# Patient Record
Sex: Male | Born: 1980 | Race: White | Hispanic: No | Marital: Married
Health system: Midwestern US, Academic
[De-identification: ages and names within clinical notes are randomized; demographics above are authoritative.]

## PROBLEM LIST (undated history)

## (undated) DIAGNOSIS — I639 Cerebral infarction, unspecified: Principal | ICD-10-CM

## (undated) DIAGNOSIS — I509 Heart failure, unspecified: Secondary | ICD-10-CM

## (undated) DIAGNOSIS — M359 Systemic involvement of connective tissue, unspecified: Secondary | ICD-10-CM

## (undated) DIAGNOSIS — E119 Type 2 diabetes mellitus without complications: Secondary | ICD-10-CM

## (undated) DIAGNOSIS — D860 Sarcoidosis of lung: Secondary | ICD-10-CM

## (undated) DIAGNOSIS — I10 Essential (primary) hypertension: Secondary | ICD-10-CM

## (undated) HISTORY — PX: TONSILLECTOMY: SUR1361

---
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
## 2012-06-19 ENCOUNTER — Encounter: Payer: Self-pay | Admitting: Internal Medicine

## 2012-06-19 ENCOUNTER — Other Ambulatory Visit (INDEPENDENT_AMBULATORY_CARE_PROVIDER_SITE_OTHER): Payer: Self-pay

## 2012-06-19 ENCOUNTER — Ambulatory Visit (INDEPENDENT_AMBULATORY_CARE_PROVIDER_SITE_OTHER)
Admission: RE | Admit: 2012-06-19 | Discharge: 2012-06-19 | Disposition: A | Discharge status: Home / Self Care | Payer: Self-pay | Source: Ambulatory Visit | Attending: Internal Medicine | Admitting: Internal Medicine

## 2012-06-19 ENCOUNTER — Ambulatory Visit (INDEPENDENT_AMBULATORY_CARE_PROVIDER_SITE_OTHER): Payer: Self-pay | Admitting: Internal Medicine

## 2012-06-19 VITALS — BP 130/92 | HR 85 | Temp 98.3°F | Ht 70.0 in | Wt >= 6400 oz

## 2012-06-19 DIAGNOSIS — D869 Sarcoidosis, unspecified: Secondary | ICD-10-CM

## 2012-06-19 DIAGNOSIS — R635 Abnormal weight gain: Secondary | ICD-10-CM | POA: Insufficient documentation

## 2012-06-19 LAB — HEPATIC FUNCTION PANEL
ALT: 19 U/L (ref 0–53)
AST: 19 U/L (ref 0–37)
Bilirubin, Direct: 0.1 mg/dL (ref 0.0–0.3)
Total Bilirubin: 0.4 mg/dL (ref 0.3–1.2)
Total Protein: 7.6 g/dL (ref 6.0–8.3)

## 2012-06-19 LAB — SEDIMENTATION RATE: Sed Rate: 29 mm/hr — ABNORMAL HIGH (ref 0–22)

## 2012-06-19 LAB — BASIC METABOLIC PANEL
BUN: 12 mg/dL (ref 6–23)
Calcium: 8.9 mg/dL (ref 8.4–10.5)
Creatinine, Ser: 1 mg/dL (ref 0.4–1.5)
GFR: 113.5 mL/min (ref >60.00)
Glucose, Bld: 151 mg/dL — ABNORMAL HIGH (ref 70–99)
Potassium: 3.6 mEq/L (ref 3.5–5.1)

## 2012-06-19 MED ORDER — HYDROXYCHLOROQUINE SULFATE 200 MG PO TABS: 200.0000 mg | ORAL_TABLET | Freq: Every day | ORAL | Status: DC

## 2012-06-19 NOTE — Patient Instructions (Addendum)
Sarcoidosis is a benign inflammatory condition caused by  The  immune system being too revved up like a thermostat on your furnace that's partially  stuck causing arthitis, rash, short of breath and cough and vision issues.  It typically burns itself out in 75% of patients by the end of 3 years with little to indicate that we really change the natural course of the disease by aggressive treatments intended to alter it.  Treatment is generally reserved for patients with major symptoms we can attribute to sarcoid or if a vital organ (like the eye or kidney or nervous system) becomes affected.    Please remember to go to the lab and x-ray department downstairs for your tests - we will call you with the results when they are available.    Plaquenil 200 mg one daily   Stop smoking before smoking stops you  Please schedule a follow up office visit in 4 weeks, sooner if needed with pfts

## 2012-06-19 NOTE — Progress Notes (Signed)
  Subjective:    Patient ID: Stephen Conley, male    DOB: [birth year]  MRN: [id number]  HPI  40 yobm smoker dx with sarcoidosis around 2010 [place] self referred to pulmonary clinic 06/19/2012 for sob   06/19/2012  1st pulmonary eval cc dx 4 years prior to OV   sarcoid for rash and sob dx by skin  bx rx steroids x around 2 years and since quit breathing is back to worse and rash back.  Gained 70 lb on prednsione and no better since stopped.  Doe x 50 ft..  No obvious daytime variabilty or assoc excess mucus production or cp or chest tightness, subjective wheeze overt sinus or hb symptoms. No unusual exp hx or h/o childhood pna/ asthma or premature birth to his knowledge.   Sleeping ok propped up without nocturnal  or early am exacerbation  of respiratory  c/o's or need for noct saba. Also denies any obvious fluctuation of symptoms with weather or environmental changes or other aggravating or alleviating factors except as outlined above    Review of Systems  Constitutional: Negative for fever and unexpected weight change.  HENT: Positive for sneezing and dental problem. Negative for ear pain, nosebleeds, congestion, sore throat, rhinorrhea, trouble swallowing, postnasal drip and sinus pressure.   Eyes: Negative for redness and itching.  Respiratory: Positive for cough and shortness of breath. Negative for chest tightness and wheezing.   Cardiovascular: Negative for palpitations and leg swelling.  Gastrointestinal: Negative for nausea and vomiting.  Genitourinary: Negative for dysuria.  Musculoskeletal: Positive for joint swelling.  Skin: Negative for rash.  Neurological: Negative for headaches.  Hematological: Does not bruise/bleed easily.  Psychiatric/Behavioral: Negative for dysphoric mood. The patient is not nervous/anxious.        Objective:   Physical Exam Obese bm nad  Wt Readings from Last 3 Encounters:  06/19/12 465 lb (210.923 kg)    Gen: No acute distress. Massively  obese but ambulatory  Neurological: Alert and oriented to person, place, and time. Coordination normal.  Head: Normocephalic and atraumatic.  Eyes: Conjunctivae are normal. Pupils are equal, round, and reactive to light. No scleral icterus.  Neck: Normal range of motion. Neck supple. No tracheal deviation or thyromegaly present. No cervical lymphadenopathy.  Cardiovascular: Normal rate, regular rhythm, normal heart sounds and intact distal pulses. Exam reveals no gallop and no friction rub. No murmur heard.  Respiratory: Effort normal. No respiratory distress. No chest wall tenderness. Breath sounds normal. No wheezes, rales or rhonchi.  GI: Soft. Bowel sounds are normal. The abdomen is soft and nontender. There is no rebound and no guarding.  Musculoskeletal: Normal range of motion. Extremities are nontender.  Skin: Skin is warm and dry.  Scattered hyperpigmented macular lesions all < nickle sized mostly over trunk and sparing face and ext. Psychiatric: Normal mood and affect. Behavior is unusual, ? A bit manic?   CXR  06/19/2012 :   Scarring in the bases. No appreciable adenopathy. No consolidation. The classic upper lobe interstitial disease seen with later phases of sarcoidosis is not appreciable on this study.   Labs 1/14 with nl tsh, esr, lfts and calcium      Assessment & Plan:

## 2012-06-20 NOTE — Assessment & Plan Note (Signed)
-   TSH nl 06/19/12  Undoubtedly partly related to steroid exposure which needs to be avoided in absence of solid evidence for significant active sarcoid which is lacking at this point  reviewed with pt: Weight control is simply a matter of calorie balance which needs to be tilted in your favor by eating less and exercising more.  To get the most out of exercise, you need to be continuously aware that you are short of breath, but never out of breath, for 30 minutes daily. As you improve, it will actually be easier for you to do the same amount of exercise  in  30 minutes so always push to the level where you are short of breath.  If this does not result in gradual weight reduction then I strongly recommend you see a nutritionist with a food diary x 2 weeks so that we can work out a negative calorie balance which is universally effective in steady weight loss programs.  Think of your calorie balance like you do your bank account where in this case you want the balance to go down so you must take in less calories than you burn up.  It's just that simple:  Hard to do, but easy to understand.  Good luck!

## 2012-06-20 NOTE — Assessment & Plan Note (Signed)
Only evidence of active sarcoid is rash  Discussed in detail all the  indications, usual  risks and alternatives  relative to the benefits with patient who agrees to proceed with trial of plaquenil and if not effective > derm eval next

## 2012-06-20 NOTE — Progress Notes (Signed)
Quick Note:  Spoke with pt and notified of results per Dr. Wert. Pt verbalized understanding and denied any questions.  ______ 

## 2012-07-24 ENCOUNTER — Ambulatory Visit: Payer: Self-pay | Admitting: Internal Medicine

## 2012-08-21 ENCOUNTER — Ambulatory Visit: Payer: Self-pay | Admitting: Internal Medicine

## 2012-09-14 ENCOUNTER — Telehealth: Payer: Self-pay | Admitting: Internal Medicine

## 2012-09-14 NOTE — Telephone Encounter (Signed)
ATC Myrene Buddy, no answer. [id number]

## 2012-09-17 NOTE — Telephone Encounter (Signed)
Spoke with pt's mother and answered questions about sarcoid but instructed that i was unable to answer questions regarding pt's health unless pt feels out authorization for [place] to do so.  She verified understanding.

## 2012-09-20 ENCOUNTER — Ambulatory Visit: Payer: Self-pay | Admitting: Internal Medicine

## 2012-09-25 ENCOUNTER — Ambulatory Visit: Payer: Self-pay | Admitting: Internal Medicine

## 2012-11-09 ENCOUNTER — Encounter: Payer: Self-pay | Admitting: Internal Medicine

## 2012-11-09 ENCOUNTER — Ambulatory Visit (INDEPENDENT_AMBULATORY_CARE_PROVIDER_SITE_OTHER): Payer: Self-pay | Admitting: Internal Medicine

## 2012-11-09 VITALS — BP 134/74 | HR 68 | Temp 97.4°F | Ht 68.0 in | Wt >= 6400 oz

## 2012-11-09 DIAGNOSIS — D869 Sarcoidosis, unspecified: Secondary | ICD-10-CM

## 2012-11-09 LAB — PULMONARY FUNCTION TEST

## 2012-11-09 NOTE — Progress Notes (Signed)
PFT done today. 

## 2012-11-09 NOTE — Patient Instructions (Addendum)
No evidence of sarcoid anywhere except in your skin - call if you would like [place] to refer you dermatologist  Congratulations on stopping smoking - unless you resume smoking your lung function should be fine though breathing may be limited by your weight   Pulmonary follow up is as needed

## 2012-11-09 NOTE — Progress Notes (Signed)
Subjective:    Patient ID: Stephen Conley, male    DOB: [birth year]  MRN: [id number]  HPI  81 yobm quit smoking May 2104  dx with sarcoidosis around 2010 Stephen Conley self referred to pulmonary clinic 06/19/2012 for sob in [place] since 2014    06/19/2012  1st pulmonary eval cc dx 4 years prior to OV   sarcoid for rash and sob dx by skin  bx rx steroids x around 2 years and since quit breathing is back to worse and rash back.  Gained 70 lb on prednsione and no better since stopped.  Doe x 50 ft..  No obvious daytime variabilty or assoc excess mucus production or cp or chest tightness, subjective wheeze overt sinus or hb symptoms. No unusual exp hx or h/o childhood pna/ asthma or premature birth to his knowledge.  rec Sarcoidosis pathophysiology   Please remember to go to the lab and x-ray department downstairs for your tests - we will call you with the results when they are available. Plaquenil 200 mg one daily  Stop smoking before smoking stops you  11/09/2012 f/u ov/[name] could not tell any difference with plaquenil so stopped it, still smking  Chief Complaint  Patient presents with  . Follow-up    Cough has improved, his breathing is unchanged since the last visit.     No obvious daytime variabilty or assoc excess mucus production or cp or chest tightness, subjective wheeze overt sinus or hb symptoms. No unusual exp hx or h/o childhood pna/ asthma or premature birth to his knowledge.   Sleeping ok propped up without nocturnal  or early am exacerbation  of respiratory  c/o's or need for noct saba. Also denies any obvious fluctuation of symptoms with weather or environmental changes or other aggravating or alleviating factors except as outlined above   Current Medications, Allergies, Past Medical History, Past Surgical History, Family History, and Social History were reviewed in [employer] record.  ROS  The following are not active complaints unless bolded sore  throat, dysphagia, dental problems, itching, sneezing,  nasal congestion or excess/ purulent secretions, ear ache,   fever, chills, sweats, unintended wt loss, pleuritic or exertional cp, hemoptysis,  orthopnea pnd or leg swelling, presyncope, palpitations, heartburn, abdominal pain, anorexia, nausea, vomiting, diarrhea  or change in bowel or urinary habits, change in stools or urine, dysuria,hematuria,  rash, arthralgias, visual complaints, headache, numbness weakness or ataxia or problems with walking or coordination,  change in mood/affect or memory.               Objective:   Physical Exam Obese bm nad   11/09/2012      476  Wt Readings from Last 3 Encounters:  06/19/12 465 lb (210.923 kg)    Gen: No acute distress. Massively obese but ambulatory  Neurological: Alert and oriented to person, place, and time. Coordination normal.  Head: Normocephalic and atraumatic.  Eyes: Conjunctivae are normal. Pupils are equal, round, and reactive to light. No scleral icterus.  Neck: Normal range of motion. Neck supple. No tracheal deviation or thyromegaly present. No cervical lymphadenopathy.  Cardiovascular: Normal rate, regular rhythm, normal heart sounds and intact distal pulses. Exam reveals no gallop and no friction rub. No murmur heard.  Respiratory: Effort normal. No respiratory distress. No chest wall tenderness. Breath sounds normal. No wheezes, rales or rhonchi.  GI: Soft. Bowel sounds are normal. The abdomen is soft and nontender. There is no rebound and no guarding.  Musculoskeletal: Normal range of  motion. Extremities are nontender.  Skin: Skin is warm and dry.  Scattered hyperpigmented macular lesions all < nickle sized mostly over trunk and sparing face and ext. Psychiatric: Normal mood and affect. Behavior is unusual, ? A bit manic?    CXR  06/19/2012 :   Scarring in the bases. No appreciable adenopathy. No consolidation. The classic upper lobe interstitial disease seen with later  phases of sarcoidosis is not appreciable on this study.   Labs 1/14 with nl tsh, esr, lfts and calcium      Assessment & Plan:

## 2012-11-10 NOTE — Assessment & Plan Note (Signed)
The only evidence of systemic sarcoid is the rash which apparently did not respond to plaquenil > f/u derm prn but no need for pulmoary f/u at this point as no evidence of active pulmonary sarcoid

## 2012-11-12 ENCOUNTER — Encounter: Payer: Self-pay | Admitting: Internal Medicine

## 2012-11-15 ENCOUNTER — Encounter: Payer: Self-pay | Admitting: Internal Medicine

## 2014-09-12 ENCOUNTER — Ambulatory Visit: Payer: 59 | Admitting: Internal Medicine

## 2014-10-09 ENCOUNTER — Ambulatory Visit: Payer: 59 | Admitting: Internal Medicine

## 2014-10-21 ENCOUNTER — Ambulatory Visit: Payer: 59 | Admitting: Family

## 2014-10-21 DIAGNOSIS — Z0289 Encounter for other administrative examinations: Secondary | ICD-10-CM

## 2014-10-29 ENCOUNTER — Emergency Department (HOSPITAL_COMMUNITY): Payer: 59

## 2014-10-29 ENCOUNTER — Inpatient Hospital Stay (HOSPITAL_COMMUNITY): Payer: 59

## 2014-10-29 ENCOUNTER — Encounter (HOSPITAL_COMMUNITY): Payer: Self-pay

## 2014-10-29 ENCOUNTER — Inpatient Hospital Stay (HOSPITAL_COMMUNITY)
Admission: EM | Admit: 2014-10-29 | Discharge: 2014-10-31 | DRG: 291 | Disposition: A | Discharge status: Home / Self Care | Payer: 59 | Attending: Family Medicine | Admitting: Family Medicine

## 2014-10-29 DIAGNOSIS — IMO0002 Reserved for concepts with insufficient information to code with codable children: Secondary | ICD-10-CM | POA: Insufficient documentation

## 2014-10-29 DIAGNOSIS — Z9981 Dependence on supplemental oxygen: Secondary | ICD-10-CM

## 2014-10-29 DIAGNOSIS — R0902 Hypoxemia: Secondary | ICD-10-CM

## 2014-10-29 DIAGNOSIS — I509 Heart failure, unspecified: Secondary | ICD-10-CM

## 2014-10-29 DIAGNOSIS — Z6841 Body Mass Index (BMI) 40.0 and over, adult: Secondary | ICD-10-CM | POA: Diagnosis not present

## 2014-10-29 DIAGNOSIS — F1290 Cannabis use, unspecified, uncomplicated: Secondary | ICD-10-CM | POA: Diagnosis present

## 2014-10-29 DIAGNOSIS — Z7982 Long term (current) use of aspirin: Secondary | ICD-10-CM | POA: Diagnosis not present

## 2014-10-29 DIAGNOSIS — R0689 Other abnormalities of breathing: Secondary | ICD-10-CM

## 2014-10-29 DIAGNOSIS — J984 Other disorders of lung: Secondary | ICD-10-CM | POA: Diagnosis present

## 2014-10-29 DIAGNOSIS — J9602 Acute respiratory failure with hypercapnia: Secondary | ICD-10-CM | POA: Diagnosis present

## 2014-10-29 DIAGNOSIS — R0609 Other forms of dyspnea: Secondary | ICD-10-CM | POA: Diagnosis not present

## 2014-10-29 DIAGNOSIS — F17210 Nicotine dependence, cigarettes, uncomplicated: Secondary | ICD-10-CM | POA: Diagnosis present

## 2014-10-29 DIAGNOSIS — E662 Morbid (severe) obesity with alveolar hypoventilation: Secondary | ICD-10-CM | POA: Diagnosis present

## 2014-10-29 DIAGNOSIS — J9601 Acute respiratory failure with hypoxia: Secondary | ICD-10-CM | POA: Diagnosis present

## 2014-10-29 DIAGNOSIS — J9622 Acute and chronic respiratory failure with hypercapnia: Secondary | ICD-10-CM | POA: Diagnosis not present

## 2014-10-29 DIAGNOSIS — I5031 Acute diastolic (congestive) heart failure: Principal | ICD-10-CM | POA: Diagnosis present

## 2014-10-29 DIAGNOSIS — Z713 Dietary counseling and surveillance: Secondary | ICD-10-CM | POA: Diagnosis present

## 2014-10-29 DIAGNOSIS — D869 Sarcoidosis, unspecified: Secondary | ICD-10-CM | POA: Diagnosis present

## 2014-10-29 DIAGNOSIS — Z716 Tobacco abuse counseling: Secondary | ICD-10-CM | POA: Diagnosis present

## 2014-10-29 DIAGNOSIS — E1165 Type 2 diabetes mellitus with hyperglycemia: Secondary | ICD-10-CM | POA: Diagnosis present

## 2014-10-29 DIAGNOSIS — G4733 Obstructive sleep apnea (adult) (pediatric): Secondary | ICD-10-CM

## 2014-10-29 DIAGNOSIS — R0602 Shortness of breath: Secondary | ICD-10-CM | POA: Diagnosis present

## 2014-10-29 DIAGNOSIS — I10 Essential (primary) hypertension: Secondary | ICD-10-CM | POA: Diagnosis present

## 2014-10-29 DIAGNOSIS — E119 Type 2 diabetes mellitus without complications: Secondary | ICD-10-CM | POA: Diagnosis not present

## 2014-10-29 DIAGNOSIS — J810 Acute pulmonary edema: Secondary | ICD-10-CM | POA: Diagnosis not present

## 2014-10-29 DIAGNOSIS — I272 Other secondary pulmonary hypertension: Secondary | ICD-10-CM | POA: Diagnosis present

## 2014-10-29 HISTORY — DX: Essential (primary) hypertension: I10

## 2014-10-29 HISTORY — DX: Type 2 diabetes mellitus without complications: E11.9

## 2014-10-29 HISTORY — DX: Systemic involvement of connective tissue, unspecified: M35.9

## 2014-10-29 HISTORY — DX: Sarcoidosis of lung: D86.0

## 2014-10-29 HISTORY — DX: Heart failure, unspecified: I50.9

## 2014-10-29 LAB — I-STAT TROPONIN, ED: Troponin i, poc: 0 ng/mL (ref 0.00–0.08)

## 2014-10-29 LAB — CBC WITH DIFFERENTIAL/PLATELET
Basophils Absolute: 0 10*3/uL (ref 0.0–0.1)
Basophils Relative: 0 % (ref 0–1)
EOS ABS: 0.3 10*3/uL (ref 0.0–0.7)
EOS PCT: 4 % (ref 0–5)
HCT: 52 % (ref 39.0–52.0)
HEMOGLOBIN: 16.7 g/dL (ref 13.0–17.0)
LYMPHS ABS: 1.3 10*3/uL (ref 0.7–4.0)
Lymphocytes Relative: 18 % (ref 12–46)
MCH: 25.6 pg — AB (ref 26.0–34.0)
MCHC: 32.1 g/dL (ref 30.0–36.0)
MCV: 79.8 fL (ref 78.0–100.0)
MONO ABS: 0.4 10*3/uL (ref 0.1–1.0)
Monocytes Relative: 6 % (ref 3–12)
NEUTROS PCT: 72 % (ref 43–77)
Neutro Abs: 5.1 10*3/uL (ref 1.7–7.7)
Platelets: 247 10*3/uL (ref 150–400)
RBC: 6.52 MIL/uL — AB (ref 4.22–5.81)
RDW: 17 % — ABNORMAL HIGH (ref 11.5–15.5)
WBC: 7.1 10*3/uL (ref 4.0–10.5)

## 2014-10-29 LAB — TROPONIN I

## 2014-10-29 LAB — GLUCOSE, CAPILLARY
GLUCOSE-CAPILLARY: 123 mg/dL — AB (ref 65–99)
Glucose-Capillary: 148 mg/dL — ABNORMAL HIGH (ref 65–99)

## 2014-10-29 LAB — TSH: TSH: 1.993 u[IU]/mL (ref 0.350–4.500)

## 2014-10-29 LAB — I-STAT ARTERIAL BLOOD GAS, ED
ACID-BASE EXCESS: 3 mmol/L — AB (ref 0.0–2.0)
BICARBONATE: 32.9 meq/L — AB (ref 20.0–24.0)
O2 SAT: 92 %
PCO2 ART: 69.6 mmHg — AB (ref 35.0–45.0)
PH ART: 7.282 — AB (ref 7.350–7.450)
Patient temperature: 98.4
TCO2: 35 mmol/L (ref 0–100)
pO2, Arterial: 73 mmHg — ABNORMAL LOW (ref 80.0–100.0)

## 2014-10-29 LAB — BASIC METABOLIC PANEL
ANION GAP: 10 (ref 5–15)
BUN: 8 mg/dL (ref 6–20)
CHLORIDE: 98 mmol/L — AB (ref 101–111)
CO2: 32 mmol/L (ref 22–32)
Calcium: 9 mg/dL (ref 8.9–10.3)
Creatinine, Ser: 1.12 mg/dL (ref 0.61–1.24)
GFR calc Af Amer: >60 mL/min (ref >60)
GLUCOSE: 202 mg/dL — AB (ref 65–99)
Potassium: 4.6 mmol/L (ref 3.5–5.1)
Sodium: 140 mmol/L (ref 135–145)

## 2014-10-29 LAB — MRSA PCR SCREENING: MRSA by PCR: NEGATIVE

## 2014-10-29 LAB — I-STAT CG4 LACTIC ACID, ED
Lactic Acid, Venous: 2.17 mmol/L (ref 0.5–2.0)
Lactic Acid, Venous: 1.15 mmol/L (ref 0.5–2.0)

## 2014-10-29 LAB — BRAIN NATRIURETIC PEPTIDE: B Natriuretic Peptide: 88.6 pg/mL (ref 0.0–100.0)

## 2014-10-29 MED ORDER — HEPARIN SODIUM (PORCINE) 5000 UNIT/ML IJ SOLN
5000.0000 [IU] | Freq: Three times a day (TID) | INTRAMUSCULAR | Status: DC
  Administered 2014-10-29 – 2014-10-31 (×6): 5000 [IU] via SUBCUTANEOUS
  Filled 2014-10-29 (×6): qty 1

## 2014-10-29 MED ORDER — SODIUM CHLORIDE 0.9 % IJ SOLN
3.0000 mL | Freq: Two times a day (BID) | INTRAMUSCULAR | Status: DC
  Administered 2014-10-30 (×2): 3 mL via INTRAVENOUS

## 2014-10-29 MED ORDER — ACETAMINOPHEN 650 MG RE SUPP: 650.0000 mg | Freq: Four times a day (QID) | RECTAL | Status: DC | PRN

## 2014-10-29 MED ORDER — PERFLUTREN LIPID MICROSPHERE
1.0000 mL | INTRAVENOUS | Status: AC | PRN
  Administered 2014-10-29: 2 mL via INTRAVENOUS
  Filled 2014-10-29: qty 10

## 2014-10-29 MED ORDER — IOHEXOL 350 MG/ML SOLN
100.0000 mL | Freq: Once | INTRAVENOUS | Status: AC | PRN
  Administered 2014-10-29: 100 mL via INTRAVENOUS

## 2014-10-29 MED ORDER — SODIUM CHLORIDE 0.9 % IV SOLN: 250.0000 mL | INTRAVENOUS | Status: DC | PRN

## 2014-10-29 MED ORDER — SODIUM CHLORIDE 0.9 % IJ SOLN: 3.0000 mL | INTRAMUSCULAR | Status: DC | PRN

## 2014-10-29 MED ORDER — SODIUM CHLORIDE 0.9 % IJ SOLN
3.0000 mL | Freq: Two times a day (BID) | INTRAMUSCULAR | Status: DC
  Administered 2014-10-29 – 2014-10-31 (×5): 3 mL via INTRAVENOUS

## 2014-10-29 MED ORDER — POTASSIUM CHLORIDE CRYS ER 20 MEQ PO TBCR
20.0000 meq | EXTENDED_RELEASE_TABLET | Freq: Once | ORAL | Status: AC
  Administered 2014-10-29: 20 meq via ORAL
  Filled 2014-10-29: qty 1

## 2014-10-29 MED ORDER — FUROSEMIDE 10 MG/ML IJ SOLN
40.0000 mg | INTRAMUSCULAR | Status: AC
  Administered 2014-10-29: 40 mg via INTRAVENOUS
  Filled 2014-10-29: qty 4

## 2014-10-29 MED ORDER — OXYCODONE HCL 5 MG PO TABS: 5.0000 mg | ORAL_TABLET | Freq: Once | ORAL | Status: DC

## 2014-10-29 MED ORDER — INSULIN ASPART 100 UNIT/ML ~~LOC~~ SOLN
0.0000 [IU] | Freq: Three times a day (TID) | SUBCUTANEOUS | Status: DC
  Administered 2014-10-29 – 2014-10-30 (×2): 1 [IU] via SUBCUTANEOUS

## 2014-10-29 MED ORDER — ACETAMINOPHEN 325 MG PO TABS
650.0000 mg | ORAL_TABLET | Freq: Once | ORAL | Status: AC
  Administered 2014-10-29: 650 mg via ORAL
  Filled 2014-10-29: qty 2

## 2014-10-29 MED ORDER — HYDRALAZINE HCL 20 MG/ML IJ SOLN
5.0000 mg | INTRAMUSCULAR | Status: DC | PRN
  Administered 2014-10-29: 5 mg via INTRAVENOUS
  Filled 2014-10-29: qty 1

## 2014-10-29 MED ORDER — NITROGLYCERIN IN D5W 200-5 MCG/ML-% IV SOLN
0.0000 ug/min | Freq: Once | INTRAVENOUS | Status: AC
  Administered 2014-10-29: 5 ug/min via INTRAVENOUS
  Filled 2014-10-29: qty 250

## 2014-10-29 MED ORDER — ACETAMINOPHEN 325 MG PO TABS
650.0000 mg | ORAL_TABLET | Freq: Four times a day (QID) | ORAL | Status: DC | PRN
  Administered 2014-10-30: 650 mg via ORAL
  Filled 2014-10-29: qty 2

## 2014-10-29 MED ORDER — ASPIRIN EC 81 MG PO TBEC
81.0000 mg | DELAYED_RELEASE_TABLET | Freq: Every day | ORAL | Status: DC
  Administered 2014-10-30 – 2014-10-31 (×2): 81 mg via ORAL
  Filled 2014-10-29 (×2): qty 1

## 2014-10-29 MED ORDER — FUROSEMIDE 10 MG/ML IJ SOLN
40.0000 mg | Freq: Once | INTRAMUSCULAR | Status: AC
  Administered 2014-10-29: 40 mg via INTRAVENOUS
  Filled 2014-10-29: qty 4

## 2014-10-29 NOTE — H&P (Signed)
Family Medicine [facility] Admission History and Physical Service Pager: [phone]  Patient name: Stephen Conley Medical record number: [id number] Date of birth: 06/13/1980 Age: 34 y.o. Gender: male  Primary Care Provider: No primary care provider on file. Consultants: none Code Status: full code (discussed with pt upon admission)  Chief Complaint: dyspnea, chest pain  Assessment and Plan: Stephen Conley is a 34 y.o. male presenting with chest pain and dypsnea. PMH is significant for sarcoidosis and morbid obesity.  # Dyspnea & chest pain: initial O2 sats in 70's, requiring supplemental O2 with 15 L via nonrebreather. Initial ABG with evidence of respiratory acidosis, pH 7.28. Differential diagnosis includes ACS, PE, flare of sarcoidosis, and CHF. Chest x-ray with evidence of CHF. Given acute onset, initially had high concern for pulmonary embolus but fortunately CTA was negative for PE. Blood pressures have been relatively markedly elevated as high as 200s/120s, raising concern for hypertensive emergency. Has been placed on nitroglycerin drip for blood pressure control in setting of acute CHF exacerbation. -Admit to stepdown unit -Continue nitroglycerin drip -Monitor on telemetry -Cycle troponins 3 -EKG in a.m. -Risk stratification: A1c, TSH, lipids -Echocardiogram -Status post 1 dose of 40 mg IV Lasix, monitor diuresis and repeat as needed -Strict intake and output -Aspirin 81 mg daily -Hydralazine IV as needed for elevated pressures -May benefit from cardiology evaluation during this hospitalization, but we'll await above studies prior to consultation  # Hyperglycemia: glucose noted to be 200. Unsure if acute adrenal response to volume overload versus chronic undiagnosed diabetes. - check A1c - sensitive SSI while inpatient  # Sarcoidosis: needs outpatient pulm follow up, as well as to establish with PCP. Has appointment scheduled to establish care with PCP on  June 3 at [facility].  FEN/GI: SLIV, HH diet Prophylaxis: SQ heparin  Disposition: admit to stepdown, dispo pending further eval  History of Present Illness: Stephen Conley is a 34 y.o. male presenting with chest pain and dyspnea.  Patient reports that around 5 this morning he had sudden onset of chest pain and shortness of breath. He thus came to the hospital. The pain is located in the middle of his chest. He has noticed increased swelling in his legs over the last several months. He has a history of sarcoidosis but has not followed up with pulmonology in several years. Does not have a PCP currently. He does not take any medications chronically. He does smoke daily, 2-3 cigarettes per day for the last 10 years. He endorses 1 episode of exertional chest pain recently. Denies any history of cardiac problems or blood clots.  He states he is eating and drinking well, normal bowel and bladder function.  Review Of Systems: Per HPI. Otherwise 12 point review of systems was performed and was unremarkable.  Patient Active Problem List   Diagnosis Date Noted  . 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 Hypoxia 10/29/2014  . Sarcoid 06/19/2012  . Weight gain 06/19/2012   Past Medical History: Past Medical History  Diagnosis Date  . Sarcoidosis, lung   . Collagen vascular disease    Past Surgical History: Past Surgical History  Procedure Laterality Date  . Tonsillectomy     Social History: History  Substance Use Topics  . Smoking status: Former Smoker -- 0.80 packs/day for 10 years    Types: Cigarettes    Quit date: 10/09/2012  . Smokeless tobacco: Never Used  . Alcohol Use: Not on file   Additional social history: active smoker (2-3 cigs/day for 10  years). Occasional MJ use, denies other drug use. Occasional alcohol (nothing daily)  Please also refer to relevant sections of EMR.  Family History: No family history on file. Allergies and Medications: No Known  Allergies No current facility-administered medications on file prior to encounter.   Current Outpatient Prescriptions on File Prior to Encounter  Medication Sig Dispense Refill  . hydroxychloroquine (PLAQUENIL) 200 MG tablet Take 1 tablet (200 mg total) by mouth daily. (Patient not taking: Reported on 10/29/2014) 30 tablet 2    Objective: BP 184/120 mmHg  Pulse 81  Temp(Src) 98.4 F (36.9 C) (Oral)  Resp 26  Ht  (1.727 m)  Wt 470 lb 3.2 oz (213.281 kg)  BMI 71.51 kg/m2  SpO2 95% Exam: General: No acute distress, pleasant, cooperative Eyes: Extraocular movements intact ENTM: Normocephalic, atraumatic, oropharynx clear and moist. Nasal cannula in place Neck: No meningeal signs Cardiovascular: Heart sounds distant. Regular rate and rhythm, no murmur Respiratory: Diminished breath sounds throughout likely secondary to habitus, some crackles present in bilateral lower lobes Abdomen: Normoactive bowel sounds, soft, nontender to palpation, morbidly obese MSK: 2+ lower extremity edema bilaterally, legs markedly obese, 2+ DP pulses bilaterally Skin: No rashes noted, some dry skin on feet Neuro: Grossly nonfocal, speech normal Psych: Normal affect, makes jokes during history taking, pleasant  Labs and Imaging: CBC BMET   Recent Labs Lab 10/29/14 0630  WBC 7.1  HGB 16.7  HCT 52.0  PLT 247    Recent Labs Lab 10/29/14 0630  NA 140  K 4.6  CL 98*  CO2 32  BUN 8  CREATININE 1.12  GLUCOSE 202*  CALCIUM 9.0     EKG sinus rhythm, no ST or T wave changes  CXR: Congestive heart failure. Likely underlying changes of sarcoid in this patient with a known history.  CTA Chest: -No evidence of pulmonary emboli. -Cardiomegaly with bilateral hazy perihilar opacification likely mild interstitial edema. -Minimal mediastinal adenopathy likely related to patient's known Sarcoid.  BNP 89 Lactic acid 2.17>1.15   Latrelle Dodrill, MD 10/29/2014, 10:55 AM PGY-3, [facility]  Health Family Medicine FPTS Intern pager: [phone], text pages welcome

## 2014-10-29 NOTE — ED Notes (Signed)
Pt on 4 L [place] oxygen saturation at 99%

## 2014-10-29 NOTE — Consult Note (Signed)
Name: Stephen Conley MRN: [id number] DOB: 1980-09-08    ADMISSION DATE:  10/29/2014 CONSULTATION DATE:  10/29/2014  REFERRING MD :  Gwendolyn Grant  CHIEF COMPLAINT:  SOB, chest pain  BRIEF PATIENT DESCRIPTION: 34 y.o. morbidly obese M with PMH of sarcoidosis and CHF brought to [facility] ED 5/25 with SOB and chest pain.  Found to have pulmonary edema and probable CHF exacerbation.  PCCM consulted for dyspnea.  SIGNIFICANT EVENTS  5/25 - admitted with SOB, chest pain  STUDIES:  CTA Chest 5/25 >>> neg for PE.  Cardiomegaly with b/l perihilar opacities and interstitial edema.  Mediastinal adenopathy most likely related to pt's known sarcoid.   HISTORY OF PRESENT ILLNESS:  Stephen Conley is a 69 y.o. Morbidly obese male with PMH of sarcoidosis (followed by Dr. Sherene Sires) and CHF who presented to [facility] ED 5/25 with chest pain and SOB.  Pain was mid sternum and non - radiating.  He has seen a pulmonologist for sarcoid before but it was several years ago in [place].  He is a current smoker, 2 - 3 cigarettes per day for the past 10 years.  No hx of clots of malignancy, no recent travel.    In ED, he was initially hypoxic with sats in 70 on room air.  He was placed on NRB with improvement to 100%.  He was later able to be switched to [facility] and maintained sats of 96% on 3L O2.  CTA in ED was negative for PE but showed bilateral opacities and interstitial edema.  Mediastinal adenopathy was also noted, most likely reflective of his known sarcoid.  Per Dr. Thurston Hole notes, pt was previously on steroids for his sarcoid but stopped sometime in 2012.  PCCM was consulted for recs regarding his dyspnea.  He denies fevers/chills/sweats, headache, cough, N/V/D, abd pain, myalgias. He endorses LE edema, exertional dyspnea, and orthopnea (sleeps with 3 pillows).  He does not work and is sedentary at baseline with minimal movement around the house.   PAST MEDICAL HISTORY :   has a past medical history of Sarcoidosis, lung; Collagen  vascular disease; and Congestive heart disease.  has past surgical history that includes Tonsillectomy. Prior to Admission medications   Medication Sig Start Date End Date Taking? Authorizing Provider  hydroxychloroquine (PLAQUENIL) 200 MG tablet Take 1 tablet (200 mg total) by mouth daily. Patient not taking: Reported on 10/29/2014 06/19/12   Nyoka Cowden, MD   No Known Allergies  FAMILY HISTORY:  family history is not on file. SOCIAL HISTORY:  reports that he quit smoking about 12 months ago. His smoking use included Cigarettes. He has a 8 pack-year smoking history. He has never used smokeless tobacco. He reports that he uses illicit drugs (Marijuana). He reports that he does not drink alcohol.  REVIEW OF SYSTEMS:   All negative; except for those that are bolded, which indicate positives.  Constitutional: weight loss, weight gain, night sweats, fevers, chills, fatigue, weakness.  HEENT: headaches, sore throat, sneezing, nasal congestion, post nasal drip, difficulty swallowing, tooth/dental problems, visual complaints, visual changes, ear aches. Neuro: difficulty with speech, weakness, numbness, ataxia. CV:  chest pain, orthopnea, PND, swelling in lower extremities, dizziness, palpitations, syncope.  Resp: cough, hemoptysis, dyspnea, wheezing. GI  heartburn, indigestion, abdominal pain, nausea, vomiting, diarrhea, constipation, change in bowel habits, loss of appetite, hematemesis, melena, hematochezia.  GU: dysuria, change in color of urine, urgency or frequency, flank pain, hematuria. MSK: joint pain or swelling, decreased range of motion. Psych: change in mood or affect,  depression, anxiety, suicidal ideations, homicidal ideations. Skin: rash, itching, bruising.   SUBJECTIVE:  Reports breathing much better.  He states he is chronically dyspneic, even at rest.  Chest pain improved.  VITAL SIGNS: Temp:  [98.4 F (36.9 C)] 98.4 F (36.9 C) (05/25 0617) Pulse Rate:  [81-95] 95  (05/25 1536) Resp:  [21-36] 26 (05/25 1536) BP: (145-207)/(95-138) 145/96 mmHg (05/25 1536) SpO2:  [78 %-100 %] 95 % (05/25 1536) Weight:  [210.968 kg (465 lb 1.6 oz)-213.281 kg (470 lb 3.2 oz)] 210.968 kg (465 lb 1.6 oz) (05/25 1313)  PHYSICAL EXAMINATION: General: Morbidly obese male, resting in bed, in NAD. Neuro: A&O x 3, non-focal.  HEENT: [place]/AT. PERRL, sclerae anicteric. Cardiovascular: Heart sounds distant, RRR, no M/R/G.  Lungs: Respirations even and unlabored.  CTA bilaterally, No W/R/R. Abdomen: Morbidly obese, BS x 4, soft, NT/ND.  Musculoskeletal: No gross deformities, no edema.  Skin: Intact, warm, multiple pigment lesions on trunk.   Recent Labs Lab 10/29/14 0630  NA 140  K 4.6  CL 98*  CO2 32  BUN 8  CREATININE 1.12  GLUCOSE 202*    Recent Labs Lab 10/29/14 0630  HGB 16.7  HCT 52.0  WBC 7.1  PLT 247   Ct Angio Chest Pe W/cm &/or Wo Cm  10/29/2014   CLINICAL DATA:  Hypoxia and history of sarcoidosis.  EXAM: CT ANGIOGRAPHY CHEST WITH CONTRAST  TECHNIQUE: Multidetector CT imaging of the chest was performed using the standard protocol during bolus administration of intravenous contrast. Multiplanar CT image reconstructions and MIPs were obtained to evaluate the vascular anatomy.  CONTRAST:  100mL OMNIPAQUE IOHEXOL 350 MG/ML SOLN  COMPARISON:  Chest x-ray today.  FINDINGS: Lungs are adequately inflated and demonstrate mild hazy bilateral perihilar opacification likely mild interstitial edema. No evidence of pleural effusion. Subtle linear atelectasis over the right middle lobe. Airway is within normal.  Prominent overlying soft tissues causing mild image degradation. Mild to moderate cardiomegaly. Although timing of contrast bolus appears adequate, pulmonary arterial opacification is not optimal as there are no significant emboli detected. 1 cm prevascular lymph node as well as 1.4 cm right subcarinal lymph node which may be due to patient's known sarcoid. No  significant hilar or axillary adenopathy.  Images through the upper abdomen are within normal. There is mild degenerate change of the spine.  Review of the MIP images confirms the above findings.  IMPRESSION: No evidence of pulmonary emboli.  Cardiomegaly with bilateral hazy perihilar opacification likely mild interstitial edema.  Minimal mediastinal adenopathy likely related to patient's known sarcoid.   Electronically Signed   By: Elberta Fortisaniel  Boyle M.D.   On: 10/29/2014 10:46   Dg Chest Port 1 View  10/29/2014   CLINICAL DATA:  Chest pain and shortness of breath.  EXAM: PORTABLE CHEST - 1 VIEW  COMPARISON:  06/19/2012.  FINDINGS: Trachea is midline. Heart is enlarged. Diffuse mixed interstitial and airspace opacification. Small bilateral effusions. Pleural thickening in the lateral hemithoraces is unchanged and likely due to extrapleural fat.  IMPRESSION: Congestive heart failure. Likely underlying changes of sarcoid in this patient with a known history.   Electronically Signed   By: Leanna BattlesMelinda  Blietz M.D.   On: 10/29/2014 07:12    ASSESSMENT / PLAN:  Acute hypoxic respiratory failure - although probably has chronic hypoxia as well Pulmonary edema Probable OSA / OHS Restrictive lung disease (TLC from 2014 45% predicted) -  due to morbid obesity Tobacco use disorder Plan: Continue supplemental O2 as needed to maintain SpO2 >  92%. Aggressive diuresis as BP and SCr permit - additional  Lasix now. BiPAP nocturnally. Recommend outpatient sleep study and titration. Will need ambulatory desaturation study to assess outpatient O2 needs. Pulmonary hygiene. Mobilize as able. Weight loss strongly encouraged and stressed to pt and family members at bedside. Would probably benefit from nutrition consult for calorie counts and nutrition counseling for weight loss. Tobacco cessation counseling. Would arrange for outpatient follow up with Dr. Sherene Sires after discharge.  (Please call [facility] at  [phone]).  PCCM will sign off.  Please do not hesitate to call us back if we can be of any further assistance.  Rutherford Guys, PA - [facility] [place] [facility] [facility] Pager: [phone]  or [phone] 10/29/2014, 4:26 PM  Attending Note:  34 year old male with PMH of sarcoid and morbid obesity who moved here from [place] and essentially has been seeing no MD.  Being seen for SOB.  Sarcoidosis is not an issue here.  This is all driven by cardiac disease and his severe morbid obesity.  I reviewed the CT myself, pulmonary edema evident with severe cardiomegally and restriction of lung space.  Case discussed with resident and with PCCM-NP.  SOB: due to CHF and morbid obesity.  - Diureses as ordered.  - NTG drip.  - Wt loss.  Super morbid obesity:  - Spent a long time discussing this with patient and family.  - Consult dietitian to assist with wt loss plan.  Hypercarbic respiratory failure due to obesity and restrictive lung disease.  - BiPAP ordered while in house.  - Will need nocturnal BiPAP.  - The ABG was ordered in the morning after patient sleeping all night with no assist devices, order one for mid day.  Hypoxemia: due to above plus pulmonary edema.  - Diureses.  - NTG.  - Titrate O2 for sat of 92 or higher.  - Will need an ambulatory desat study prior to discharge to get him O2 at home.  OSA: undiagnosed but the diagnosis is not disputable here.  - Nocturnal BiPAP.  - Will need a titration study as outpatient or auto-BiPAP upon discharge.  Very likely pulmonary HTN:  - F/u on echo (done).  - Titrate O2 for sats.  Sarcoidosis is not an issue here.  Arrange F/U with PCCM upon discharge.  PCCM will follow.  Patient seen and examined, agree with above note.  I dictated the care and orders written for this patient under my direction.  Alyson Reedy, M.D. [facility] Pulmonary[facility]Critical Care Medicine. Pager: [phone]. After hours pager: [phone].

## 2014-10-29 NOTE — ED Notes (Signed)
Pt states that he had a bowel movement in his pants, he did not know that he had to go, and this is unusual for him.

## 2014-10-29 NOTE — ED Notes (Signed)
Pt now on 6 L [place] - saturation at 98%

## 2014-10-29 NOTE — Care Management Note (Signed)
Case Management Note  Patient Details  Name: Kerney ElbeChristopher Berres MRN: [id number] Date of Birth: 08/03/1980  Subjective/Objective:     Pt admitted for dyspnea, chest pain. Pt is from [place], however currently living here.               Action/Plan: CM did speak with pt's mother  Lollie MarrowYvonne Knoll @ [phone] in regards to insurance and possibility of Medicaid vs Disability. [facility] either. CM did speak with mom in ref to PCP. Once pt is medically stable for d/c CM will see if needs PCP. Pt will need Rx for generic medications once stable.   Expected Discharge Date:                  Expected Discharge Plan:  Home/Self Care  In-House Referral:  Financial Counselor  Discharge planning Services  CM Consult, Medication Assistance  Post Acute Care Choice:    Choice offered to:     DME Arranged:    DME Agency:     HH Arranged:    HH Agency:     Status of Service:     Medicare Important Message Given:  No Date Medicare IM Given:    Medicare IM give by:    Date Additional Medicare IM Given:    Additional Medicare Important Message give by:     If discussed at Long Length of Stay Meetings, dates discussed:    Additional Comments:  Gala LewandowskyGraves-Bigelow, Emric Kowalewski Kaye, RN 10/29/2014, 2:48 PM

## 2014-10-29 NOTE — ED Notes (Signed)
Pt on nasal cannula now, at 8 L to titrate down

## 2014-10-29 NOTE — Progress Notes (Signed)
  Echocardiogram 2D Echocardiogram with Definity has been performed.  Nolon RodBrown, Tony 10/29/2014, 3:56 PM

## 2014-10-29 NOTE — ED Provider Notes (Signed)
CSN: [id number]     Arrival date & time 10/29/14  0608 History   First MD Initiated Contact with Patient 10/29/14 [phone]     Chief Complaint  Patient presents with  . Shortness of Breath     (Consider location/radiation/quality/duration/timing/severity/associated sxs/prior Treatment) HPI Comments: Patient is a 34 year old male with a past medical history of sarcoidosis who presents with SOB that started last night. Symptoms started gradually and progressively worsened since last night. Patient reports associated productive cough with white sputum for the past 3 days. Exertion makes the symptoms worse. No alleviating factors. Patient denies fever or any other associated symptoms. Patient has not tried anything at home for symptoms.    Past Medical History  Diagnosis Date  . Sarcoidosis, lung    Past Surgical History  Procedure Laterality Date  . Tonsillectomy     No family history on file. History  Substance Use Topics  . Smoking status: Former Smoker -- 0.80 packs/day for 10 years    Types: Cigarettes    Quit date: 10/09/2012  . Smokeless tobacco: Never Used  . Alcohol Use: Not on file    Review of Systems  Constitutional: Negative for fever, chills and fatigue.  HENT: Negative for trouble swallowing.   Eyes: Negative for visual disturbance.  Respiratory: Positive for shortness of breath.   Cardiovascular: Negative for chest pain and palpitations.  Gastrointestinal: Negative for nausea, vomiting, abdominal pain and diarrhea.  Genitourinary: Negative for dysuria and difficulty urinating.  Musculoskeletal: Negative for arthralgias and neck pain.  Skin: Negative for color change.  Neurological: Negative for dizziness and weakness.  Psychiatric/Behavioral: Negative for dysphoric mood.      Allergies  Review of patient's allergies indicates no known allergies.  Home Medications   Prior to Admission medications   Medication Sig Start Date End Date Taking? Authorizing  Provider  Calcium-Magnesium-Zinc [phone] MG TABS Take 1 tablet by mouth 2 (two) times daily.    Historical Provider, MD  hydroxychloroquine (PLAQUENIL) 200 MG tablet Take 1 tablet (200 mg total) by mouth daily. 06/19/12   Nyoka Cowden, MD  naproxen (NAPROSYN) 500 MG tablet Take 500 mg by mouth 2 (two) times daily with a meal.    Historical Provider, MD   BP 179/118 mmHg  Pulse 90  Temp(Src) 98.4 F (36.9 C) (Oral)  Resp 29  SpO2 78% Physical Exam  Constitutional: He is oriented to person, place, and time. He appears well-developed and well-nourished. No distress.  morbidly obese  HENT:  Head: Normocephalic and atraumatic.  Eyes: Conjunctivae and EOM are normal.  Neck: Normal range of motion.  Cardiovascular: Normal rate and regular rhythm.  Exam reveals no gallop and no friction rub.   No murmur heard. Pulmonary/Chest: He has no wheezes. He has rales. He exhibits no tenderness.  Increased breathing effort. Tachypneic. Patient with 15L oxygen NRB. Rales and crackles noted bilaterally.   Abdominal: Soft. He exhibits no distension. There is no tenderness. There is no rebound.  Musculoskeletal: Normal range of motion.  Neurological: He is alert and oriented to person, place, and time. Coordination normal.  Speech is goal-oriented. Moves limbs without ataxia.   Skin: Skin is warm and dry.  Psychiatric: He has a normal mood and affect. His behavior is normal.  Nursing note and vitals reviewed.   ED Course  Procedures (including critical care time)  CRITICAL CARE Performed by: Emilia Beck   Total critical care time: 35 min  Critical care time was exclusive of separately billable  procedures and treating other patients.  Critical care was necessary to treat or prevent imminent or life-threatening deterioration.  Critical care was time spent personally by me on the following activities: development of treatment plan with patient and/or surrogate as well as nursing,  discussions with consultants, evaluation of patient's response to treatment, examination of patient, obtaining history from patient or surrogate, ordering and performing treatments and interventions, ordering and review of laboratory studies, ordering and review of radiographic studies, pulse oximetry and re-evaluation of patient's condition.   Labs Review Labs Reviewed  CBC WITH DIFFERENTIAL/PLATELET - Abnormal; Notable for the following:    RBC 6.52 (*)    MCH 25.6 (*)    RDW 17.0 (*)    All other components within normal limits  BASIC METABOLIC PANEL - Abnormal; Notable for the following:    Chloride 98 (*)    Glucose, Bld 202 (*)    All other components within normal limits  I-STAT CG4 LACTIC ACID, ED - Abnormal; Notable for the following:    Lactic Acid, Venous 2.17 (*)    All other components within normal limits  I-STAT ARTERIAL BLOOD GAS, ED - Abnormal; Notable for the following:    pH, Arterial 7.282 (*)    pCO2 arterial 69.6 (*)    pO2, Arterial 73.0 (*)    Bicarbonate 32.9 (*)    Acid-Base Excess 3.0 (*)    All other components within normal limits  CULTURE, BLOOD (ROUTINE X 2)  CULTURE, BLOOD (ROUTINE X 2)  BRAIN NATRIURETIC PEPTIDE  BLOOD GAS, ARTERIAL  I-STAT TROPOININ, ED    Imaging Review Dg Chest Port 1 View  10/29/2014   CLINICAL DATA:  Chest pain and shortness of breath.  EXAM: PORTABLE CHEST - 1 VIEW  COMPARISON:  06/19/2012.  FINDINGS: Trachea is midline. Heart is enlarged. Diffuse mixed interstitial and airspace opacification. Small bilateral effusions. Pleural thickening in the lateral hemithoraces is unchanged and likely due to extrapleural fat.  IMPRESSION: Congestive heart failure. Likely underlying changes of sarcoid in this patient with a known history.   Electronically Signed   By: Leanna BattlesMelinda  Blietz M.D.   On: 10/29/2014 07:12     EKG Interpretation   Date/Time:  Wednesday Oct 29 2014 06:14:59 EDT Ventricular Rate:  92 PR Interval:  197 QRS  Duration: 80 QT Interval:  378 QTC Calculation: 468 R Axis:   31 Text Interpretation:  Sinus rhythm Borderline prolonged PR interval LAE,  consider biatrial enlargement Confirmed by HORTON  MD, COURTNEY ([id number]) on  10/29/2014 8:04:35 AM      MDM   Final diagnoses:  SOB (shortness of breath)  Acute congestive heart failure, unspecified congestive heart failure type  Supplemental oxygen dependent    6:26 AM Patient will have sepsis workup initiated. Patient currently on 15L NRB due to 78% oxygen saturation on room air. Portable chest xray and labs pending.   8:04 AM Patient's lactic acid mildly elevated. Patient seems to be fluid overloaded based on exam and chest xray. BNP likely falsely low due to morbid obesity. Patient's oxygen saturation 96% on 3L nasal cannula.  8:24 AM Patient admitted for CHF and supplemental oxygen dependence.   [address] Saxton StreetKaitlyn ReddickSzekalski, PA-C 10/29/14 [id number]  Shon Batonourtney F Horton, MD 10/29/14 [id number]

## 2014-10-29 NOTE — ED Notes (Signed)
Pt states that "it looks like there is plastic over my eye and it's blurry"

## 2014-10-29 NOTE — ED Notes (Addendum)
Pt on 3 L [place] oxygen saturation level at 96%

## 2014-10-30 ENCOUNTER — Encounter (HOSPITAL_COMMUNITY): Payer: Self-pay | Admitting: Family Medicine

## 2014-10-30 DIAGNOSIS — J810 Acute pulmonary edema: Secondary | ICD-10-CM

## 2014-10-30 DIAGNOSIS — R0609 Other forms of dyspnea: Secondary | ICD-10-CM

## 2014-10-30 DIAGNOSIS — E1165 Type 2 diabetes mellitus with hyperglycemia: Secondary | ICD-10-CM | POA: Insufficient documentation

## 2014-10-30 DIAGNOSIS — E119 Type 2 diabetes mellitus without complications: Secondary | ICD-10-CM

## 2014-10-30 DIAGNOSIS — J9622 Acute and chronic respiratory failure with hypercapnia: Secondary | ICD-10-CM

## 2014-10-30 DIAGNOSIS — IMO0002 Reserved for concepts with insufficient information to code with codable children: Secondary | ICD-10-CM | POA: Insufficient documentation

## 2014-10-30 DIAGNOSIS — I10 Essential (primary) hypertension: Secondary | ICD-10-CM

## 2014-10-30 LAB — BLOOD GAS, ARTERIAL
Acid-Base Excess: 10.7 mmol/L — ABNORMAL HIGH (ref 0.0–2.0)
Bicarbonate: 36.7 mEq/L — ABNORMAL HIGH (ref 20.0–24.0)
Drawn by: 213381
O2 Content: 4 L/min
O2 SAT: 96.4 %
PATIENT TEMPERATURE: 98.6
PCO2 ART: 70.3 mmHg — AB (ref 35.0–45.0)
PH ART: 7.338 — AB (ref 7.350–7.450)
PO2 ART: 79.5 mmHg — AB (ref 80.0–100.0)
TCO2: 38.9 mmol/L (ref 0–100)

## 2014-10-30 LAB — BASIC METABOLIC PANEL
Anion gap: 5 (ref 5–15)
BUN: 5 mg/dL — ABNORMAL LOW (ref 6–20)
CO2: 34 mmol/L — AB (ref 22–32)
Calcium: 8.4 mg/dL — ABNORMAL LOW (ref 8.9–10.3)
Chloride: 102 mmol/L (ref 101–111)
Creatinine, Ser: 0.99 mg/dL (ref 0.61–1.24)
GFR calc Af Amer: >60 mL/min (ref >60)
Glucose, Bld: 117 mg/dL — ABNORMAL HIGH (ref 65–99)
Potassium: 4.2 mmol/L (ref 3.5–5.1)
SODIUM: 141 mmol/L (ref 135–145)

## 2014-10-30 LAB — LIPID PANEL
Cholesterol: 151 mg/dL (ref 0–200)
HDL: 34 mg/dL — ABNORMAL LOW (ref >40)
LDL Cholesterol: 103 mg/dL — ABNORMAL HIGH (ref 0–99)
TRIGLYCERIDES: 70 mg/dL (ref <150)
Total CHOL/HDL Ratio: 4.4 RATIO
VLDL: 14 mg/dL (ref 0–40)

## 2014-10-30 LAB — GLUCOSE, CAPILLARY
GLUCOSE-CAPILLARY: 126 mg/dL — AB (ref 65–99)
GLUCOSE-CAPILLARY: 106 mg/dL — AB (ref 65–99)
Glucose-Capillary: 111 mg/dL — ABNORMAL HIGH (ref 65–99)
Glucose-Capillary: 114 mg/dL — ABNORMAL HIGH (ref 65–99)

## 2014-10-30 LAB — TROPONIN I

## 2014-10-30 LAB — HEMOGLOBIN A1C
HEMOGLOBIN A1C: 7.2 % — AB (ref 4.8–5.6)
MEAN PLASMA GLUCOSE: 160 mg/dL

## 2014-10-30 MED ORDER — FUROSEMIDE 10 MG/ML IJ SOLN
40.0000 mg | Freq: Once | INTRAMUSCULAR | Status: AC
  Administered 2014-10-30: 40 mg via INTRAVENOUS
  Filled 2014-10-30: qty 4

## 2014-10-30 MED ORDER — LISINOPRIL 5 MG PO TABS
5.0000 mg | ORAL_TABLET | Freq: Every day | ORAL | Status: DC
  Administered 2014-10-30 – 2014-10-31 (×2): 5 mg via ORAL
  Filled 2014-10-30 (×2): qty 1

## 2014-10-30 MED ORDER — LABETALOL HCL 5 MG/ML IV SOLN: 10.0000 mg | INTRAVENOUS | Status: DC | PRN

## 2014-10-30 NOTE — Care Management Note (Signed)
Case Management Note  Patient Details  Name: Kerney ElbeChristopher Hedeen MRN: [id number] Date of Birth: 04/13/1981  Subjective/Objective:      Pt admitted for dyspnea, chest pain. Pt is from [place], however currently living here.    Action/Plan: CM did speak with pt's mother Lollie MarrowYvonne Bodner @ [phone] in regards to insurance and possibility of Medicaid vs Disability. [facility] see if [facility] is eligible for either. CM did speak with mom in ref to PCP. Once pt is medically stable for d/c CM will see if needs PCP. Pt will need Rx for generic medications once stable.   10-30-14 CM did speak with [facility] in ref to Bipap needed for home. Orders for Bipap will need to be placed in Epic with settings. Pt will need outpatient sleep study. CM will place sleep study form on chart to be filled out and CM will fax to set up an appointment. No further needs at this time. Gala LewandowskyGraves-Bigelow, Lathan Gieselman Kaye, RN,BSN [phone]      Expected Discharge Date:                  Expected Discharge Plan:  Home/Self Care  In-House Referral:  Financial Counselor  Discharge planning Services  CM Consult, Medication Assistance  Post Acute Care Choice:  Durable Medical Equipment Choice offered to:     DME Arranged:    DME Agency:     HH Arranged:    HH Agency:     Status of Service:     Medicare Important Message Given:  No Date Medicare IM Given:    Medicare IM give by:    Date Additional Medicare IM Given:    Additional Medicare Important Message give by:     If discussed at Long Length of Stay Meetings, dates discussed:    Additional Comments:  Gala LewandowskyGraves-Bigelow, Avis Mcmahill Kaye, RN 10/30/2014, 4:06 PM

## 2014-10-30 NOTE — Progress Notes (Signed)
Pt had 2.4 sec pause. Pt asymptomatic. VSS. MD aware. Pt in bed sleeping.  Bed is low and brakes are locked. Call bell within reach. Will continue to monitor.

## 2014-10-30 NOTE — Progress Notes (Signed)
Pt had a 2.52 second pause while sleeping. Pt asymptomatic. MD made aware. Will cont to monitor.

## 2014-10-30 NOTE — Plan of Care (Signed)
Problem: Food- and Nutrition-Related Knowledge Deficit (NB-1.1) Goal: Nutrition education Formal process to instruct or train a patient/client in a skill or to impart knowledge to help patients/clients voluntarily manage or modify food choices and eating behavior to maintain or improve health. Outcome: Adequate for Discharge Received consult for diet education for weight loss for pt with morbid obesity.  Pt in the room with his sister at time of visit. Pt states he is excited to get some information to help him loose weight, however spent most of the time looking at TV, stating how good food looks in the commercials. Pt states he eats healthy avoiding sugar and salt, but diet history shows high intakes of Kool Aid, juice, fried foods and luncheon meets. Pt admits to drinking as much as a 6 pack of beer "socially", but would not say how often. Pt states he does not eat anything until afternoon, just drinks [employer]ool [employer] and water. Encouraged pt to eat something earlier, even if it is a small amount (pt likes oatmeal) and choose water to stay hydrated through the day. Per pt he likes grilling, suggested using an over or broiled to cook at other times, instead of frying. Walked pt through "Eating Right for Healthy Weight" handout by the Academy of [employer] and Dietetics, emphasizing healthy options and substitutions based on pt's preferences. Used MyPlate method to reinforce healthy eating pattern. Encouraged pt to eat at least 2-3 meals and several snacks in between, suggesting healthier snack options. Answered pt's questions. Pt verbalized understanding, teach back method used, expect fair to good compliance.    Lucinda Spells A. [facility] Dietetic Intern Pager: [phone] 10/30/2014 11:10 AM

## 2014-10-30 NOTE — Progress Notes (Addendum)
Name: Stephen Conley MRN: [id number] DOB: [birth year]    ADMISSION DATE:  10/29/2014 CONSULTATION DATE:  10/30/2014  REFERRING MD :  Gwendolyn Grant  CHIEF COMPLAINT:  SOB, chest pain  BRIEF PATIENT DESCRIPTION: 34 y.o. morbidly obese M with PMH of sarcoidosis and CHF brought to [facility] ED 5/25 with SOB and chest pain.  Found to have pulmonary edema and probable CHF exacerbation.  PCCM consulted for dyspnea.  SIGNIFICANT EVENTS  5/25 - admitted with SOB, chest pain  STUDIES:  CTA Chest 5/25 >>> neg for PE.  Cardiomegaly with b/l perihilar opacities and interstitial edema.  Mediastinal adenopathy most likely related to pt's known sarcoid.   SUBJECTIVE:  Breathing much improved this AM, he denies SOB now. Did well with BiPAP overnight.  6 L negative since admission.  Has not yet had ambulatory desat study.  VITAL SIGNS: Temp:  [98.3 F (36.8 C)-98.9 F (37.2 C)] 98.4 F (36.9 C) (05/26 0736) Pulse Rate:  [68-95] 81 (05/26 0935) Resp:  [21-39] 36 (05/26 0935) BP: (114-187)/(65-125) 145/93 mmHg (05/26 0935) SpO2:  [90 %-97 %] 94 % (05/26 0935) Weight:  [210.968 kg (465 lb 1.6 oz)] 210.968 kg (465 lb 1.6 oz) (05/25 1313)  PHYSICAL EXAMINATION: General: Morbidly obese male, resting in bed watching TV. Neuro: A&O x 3, non-focal.  HEENT: [place]/AT, sclerae anicteric. Cardiovascular: Heart sounds distant, RRR, no M/R/G.  Lungs: Respirations even and unlabored.  CTA bilaterally, No W/R/R. Abdomen: Morbidly obese, BS x 4, soft, NT/ND.  Musculoskeletal: No gross deformities, no edema.  Skin: Intact, warm, multiple pigment lesions on trunk.   Recent Labs Lab 10/29/14 0630  NA 140  K 4.6  CL 98*  CO2 32  BUN 8  CREATININE 1.12  GLUCOSE 202*    Recent Labs Lab 10/29/14 0630  HGB 16.7  HCT 52.0  WBC 7.1  PLT 247   Ct Angio Chest Pe W/cm &/or Wo Cm  10/29/2014   CLINICAL DATA:  Hypoxia and history of sarcoidosis.  EXAM: CT ANGIOGRAPHY CHEST WITH CONTRAST  TECHNIQUE: Multidetector CT  imaging of the chest was performed using the standard protocol during bolus administration of intravenous contrast. Multiplanar CT image reconstructions and MIPs were obtained to evaluate the vascular anatomy.  CONTRAST:  OMNIPAQUE IOHEXOL 350 MG/ML SOLN  COMPARISON:  Chest x-ray today.  FINDINGS: Lungs are adequately inflated and demonstrate mild hazy bilateral perihilar opacification likely mild interstitial edema. No evidence of pleural effusion. Subtle linear atelectasis over the right middle lobe. Airway is within normal.  Prominent overlying soft tissues causing mild image degradation. Mild to moderate cardiomegaly. Although timing of contrast bolus appears adequate, pulmonary arterial opacification is not optimal as there are no significant emboli detected. 1 cm prevascular lymph node as well as 1.4 cm right subcarinal lymph node which may be due to patient's known sarcoid. No significant hilar or axillary adenopathy.  Images through the upper abdomen are within normal. There is mild degenerate change of the spine.  Review of the MIP images confirms the above findings.  IMPRESSION: No evidence of pulmonary emboli.  Cardiomegaly with bilateral hazy perihilar opacification likely mild interstitial edema.  Minimal mediastinal adenopathy likely related to patient's known sarcoid.   Electronically Signed   By: Elberta Fortis M.D.   On: 10/29/2014 10:46   Dg Chest Port 1 View  10/29/2014   CLINICAL DATA:  Chest pain and shortness of breath.  EXAM: PORTABLE CHEST - 1 VIEW  COMPARISON:  06/19/2012.  FINDINGS: Trachea is midline. Heart is enlarged.  Diffuse mixed interstitial and airspace opacification. Small bilateral effusions. Pleural thickening in the lateral hemithoraces is unchanged and likely due to extrapleural fat.  IMPRESSION: Congestive heart failure. Likely underlying changes of sarcoid in this patient with a known history.   Electronically Signed   By: Leanna BattlesMelinda  Blietz M.D.   On: 10/29/2014 07:12     ASSESSMENT / PLAN:  Acute hypoxic respiratory failure - although probably has chronic hypoxia as well Pulmonary edema Probable OSA / OHS Restrictive lung disease (TLC from 2014 45% predicted) -  due to morbid obesity Probable pulmonary hypertension Tobacco use disorder Plan: Continue supplemental O2 as needed to maintain SpO2 > 92%. Continue aggressive diuresis as BP and SCr permit - additional lasix 40mg  x 1 this AM. BiPAP nocturnally. Recommend outpatient sleep study and titration. Ambulatory desaturation study prior to discharge to assess outpatient O2 needs - still pending. Pulmonary hygiene. Mobilize as able. Weight loss strongly encouraged and stressed to pt and family members at bedside. Would probably benefit from nutrition consult for calorie counts and nutrition counseling for weight loss. Tobacco cessation counseling. Would arrange for outpatient follow up with Dr. Sherene SiresWert after discharge.  (Please call [facility] at [phone]).  PCCM will sign off.  Please do not hesitate to call us back if we can be of any further assistance.  Rutherford Guysahul Desai, PA - [facility] Pager: [phone]  or [phone] 10/30/2014, 11:10 AM  Attending Note:  34 year old male with PMH of sarcoid and morbid obesity who moved here from [place] and essentially has been seeing no MD. Being seen for SOB. Sarcoidosis is not an issue here. This is all driven by cardiac disease and his severe morbid obesity.  I reviewed the CT myself, pulmonary edema evident with severe cardiomegally and restriction of lung space.  Case discussed with with PCCM-NP.  SOB: due to CHF and morbid obesity. - Diureses as ordered. - NTG drip can likely be discontinued at this point. - Wt loss.  Super morbid obesity: - Spent a long time discussing this with patient and family. - Consult dietitian to assist with  wt loss plan.  Hypercarbic respiratory failure due to obesity and restrictive lung disease. - BiPAP ordered while in the hospital. - Will need nocturnal BiPAP upon discharge. - Repeat ABG after a night on BiPAP (in the morning) to insure that patient is able to blow off CO2 and ABG is normalized.  Hypoxemia: due to above plus pulmonary edema. - Diureses. - Titrate O2 for sat of 92 or higher. - Will need an ambulatory desat study prior to discharge to get him O2 at home.  OSA: undiagnosed but the diagnosis is not disputable here. - Nocturnal BiPAP. - Will need a titration study as outpatient or auto-BiPAP upon discharge.  Very likely pulmonary HTN: - F/u on echo (done). - Titrate O2 for sats.  - Negative almost 7 liters, will order a BMET to insure no hypokalemia with such aggressive diureses.  Sarcoidosis is not an issue here.  Arrange F/U with PCCM upon discharge.  PCCM will sign off, please call back if needed.  Patient seen and examined, agree with above note. I dictated the care and orders written for this patient under my direction.  Alyson ReedyWesam G. Yacoub, M.D. [facility]. Pager: [phone]. After hours pager: [phone].

## 2014-10-30 NOTE — Progress Notes (Signed)
Placed pt on BiPAP auto per MD order with large mask and O2 bled in. Pt tolerating well and was instructed to call respiratory if he needed any further assistance.

## 2014-10-30 NOTE — Progress Notes (Addendum)
SATURATION QUALIFICATIONS: (This note is used to comply with regulatory documentation for home oxygen)  Patient Saturations on 4 Liters of oxygen at Rest = 96%  Patient Saturations on Room Air while Ambulating = 73%  Patient Saturations on 4 Liters of oxygen while Ambulating = 91%  Please briefly explain why patient needs home oxygen: Patient diagnosed with acute diastolic heart failure with dyspnea improving on Lasix.  While resting, respiratory effort was labored and tachypneic with increased effort while ambulating.

## 2014-10-30 NOTE — Progress Notes (Signed)
Family Medicine Teaching Service Daily Progress Note Intern Pager: [phone]  Patient name: Stephen Conley Medical record number: [id number] Date of birth: 03/18/1981 Age: 34 y.o. Gender: male  Primary Care Provider: No primary care provider on file. Consultants: pulmonology Code Status: FULL  Pt Overview and Major Events to Date:  5/25: Admission  Assessment and Plan: Alric Geise is a 34 y.o. male presenting with chest pain and dypsnea. PMH is significant for sarcoidosis and morbid obesity.  # Dyspnea & chest pain: CP resolved. Dyspnea improving. initial O2 sats in 70's, requiring supplemental O2 with 15 L via nonrebreather.  Now on 4L O2 [place] saturating %.  Initial ABG with evidence of respiratory acidosis, pH 7.28.  CXR with evidence of CHF.  CTA negative for PE. S/p 2x Lasix IV 40. Trop neg x3. Echo with limited results but showing LVH, mild dilation of LA, RV poorly seen but suggestive of RV dysfunction. EKG NSR with L axis deviation  -Monitor on telemetry -Strict intake and output. Out: 6.9L/24 hours -Repeat Lasix IV  x1 -Aspirin 81 mg daily -Hydralazine IV as needed for elevated pressures -Start Lisinopril  -Cards recs: no further ischemic w/u.  Continue weight loss, smoking cessation, diuresis, risk factor modification.  # Hypercarbic respiratory failure due to obesity and restrictive lung disease -c/s pulmonology: appreciate recs  - c/s dietician for morbid obesity  - repeat ABG mid afternoon, ordered for 2pm  - BiPAP at night while in house   - ambulatory O2 saturation before discharge to get home O2  # T2DM, new diagnosis: glucose noted to be 200 on admission.  A1c 7.2;  CBGs 123-148/24 hours - sensitive SSI while inpatient - Metformin on discharge, statin on discharge  # OSA: obesity hypoventilation versus obstructive sleep apnea versus combination of both.  ABG reveals respiratory acidosis.  - BiPAP at night while in house  - Will need titration  study as outpt for auto-BiPAP @ discharge  # Sarcoidosis: needs outpatient pulm follow up, as well as to establish with PCP. Has appointment scheduled to establish care with PCP on June 3 at [facility].  #Tobacco dependence: smokes 2-3 cigs/day -nicotine patch PRN -smoking cessation counseling  FEN/GI: SLIV, HH diet Prophylaxis: SQ heparin  Disposition: admitted to stepdown, dispo pending further eval  Subjective:  Patient reports that he is feeling better this am.  He denies CP.  Breathing has improved.  Still requiring O2 via [place].  Patient urinating well.  Denies nausea, vomiting, diaphoresis, wheeze, vision change.  Objective: Temp:  [98.3 F (36.8 C)-98.9 F (37.2 C)] 98.7 F (37.1 C) (05/26 0400) Pulse Rate:  [68-95] 87 (05/26 0435) Resp:  [21-39] 33 (05/26 0435) BP: (114-201)/(65-138) 127/85 mmHg (05/26 0435) SpO2:  [90 %-100 %] 92 % (05/26 0435) Weight:  [465 lb 1.6 oz (210.968 kg)] 465 lb 1.6 oz (210.968 kg) (05/25 1313) Physical Exam: General: awake, alert, obese male, NAD Cardiovascular: RRR, no murmurs, +2 radial pulses Respiratory: breath sounds difficult to auscultate 2/2 body habitus, but no wheeze or crackles appreciated on exam Abdomen: obese, NT/ND, +BS Extremities: Warm, hyperpigmentation to shins, 2+ edema to shins, 2+ edema on posterior thighs b/l Neuro: follows commands, no focal deficits  I/O last 3 completed shifts: In: 672.5 [P.O.:480; I.V.:192.5] Out: 6900 [Urine:6900]    Laboratory:  Recent Labs Lab 10/29/14 0630  WBC 7.1  HGB 16.7  HCT 52.0  PLT 247    Recent Labs Lab 10/29/14 0630  NA 140  K 4.6  CL 98*  CO2 32  BUN 8  CREATININE 1.12  CALCIUM 9.0  GLUCOSE 202*   Cardiac Panel (last 3 results)  Recent Labs  10/29/14 1535 10/29/14 1948 10/30/14 0102  TROPONINI <0.03 <0.03 <0.03   Lipid Panel     Component Value Date/Time   CHOL 151 10/30/2014 0102   TRIG 70 10/30/2014 0102   HDL 34* 10/30/2014 0102    CHOLHDL 4.4 10/30/2014 0102   VLDL 14 10/30/2014 0102   LDLCALC 103* 10/30/2014 0102   TSH: 1.933  Imaging/Diagnostic Tests: EKG 5/26: NSR w/ Left axis deviation  2D Echo:  Study Conclusions - Left ventricle: The study is very limited. Contrast does help. LV function is normal. The cavity size was normal. Wall thickness was increased in a pattern of mild LVH. Wall motion was normal; there were no regional wall motion abnormalities. - Left atrium: The atrium was mildly dilated. - Right ventricle: RV is poorly seen. TAPSE data is not reliable. The contrast images suggest that there is RV dysfunction.  Raliegh IpAshly M Gottschalk, DO 10/30/2014, 7:06 AM PGY-1, [facility] FPTS Intern pager: [phone], text pages welcome

## 2014-10-30 NOTE — Consult Note (Signed)
CARDIOLOGY CONSULT NOTE   Patient ID: Kerney ElbeChristopher Celani MRN: [id number], DOB/AGE: 35/10/1980   Admit date: 10/29/2014 Date of Consult: 10/30/2014   Primary Physician: No primary care provider on file. Primary Cardiologist: None  Pt. Profile  34 year old morbidly obese gentleman with past history of sarcoidosis admitted with chest pain and dyspnea.  Seen today on chest pain rounds  Problem List  Past Medical History  Diagnosis Date  . Sarcoidosis, lung   . Collagen vascular disease   . Congestive heart disease     Past Surgical History  Procedure Laterality Date  . Tonsillectomy       Allergies  No Known Allergies  HPI   This 34 year old gentleman was admitted after experiencing a one day history of abdominal pain chest pain and worsening dyspnea.  He was seen in the emergency room were he was found to be in pulmonary edema with cardiomegaly on chest x-ray.  His CT angiogram was negative for pulmonary emboli.  He was hypertensive and was started on IV nitroglycerin drip. He gives a history of having been diagnosed with sarcoidosis by a physician in [place].  He has been on no medication at home.  He has been living in [place] for about a year.  He does not have any physician in [place]. Initial cardiac workup reveals normal troponins.  EKG shows normal sinus rhythm and no ischemic changes.  During sleep he has had periods of transient sinus bradycardia.  He has a history of obstructive sleep apnea.  He states that he previously had been scheduled for workup for this but failed to keep the appointment to see if he would qualify for a CPAP machine.  Inpatient Medications  . aspirin EC  81 mg Oral Daily  . heparin  5,000 Units Subcutaneous 3 times per day  . insulin aspart  0-9 Units Subcutaneous TID WC  . sodium chloride  3 mL Intravenous Q12H  . sodium chloride  3 mL Intravenous Q12H    Family History History reviewed. No pertinent family history. patient  denies any history of premature coronary disease in the family.  His father is deceased from "multiple problems".  Social History History   Social History  . Marital Status: Single    Spouse Name: N/A  . Number of Children: N/A  . Years of Education: N/A   Occupational History  . Not on file.   Social History Main Topics  . Smoking status: Former Smoker -- 0.80 packs/day for 10 years    Types: Cigarettes    Quit date: 10/09/2013  . Smokeless tobacco: Never Used  . Alcohol Use: No  . Drug Use: Yes    Special: Marijuana  . Sexual Activity: Not on file   Other Topics Concern  . Not on file   Social History Narrative     Review of Systems  General:  No chills, fever, night sweats or weight changes.  Cardiovascular:  No chest pain, dyspnea on exertion, edema, orthopnea, palpitations, paroxysmal nocturnal dyspnea. Dermatological: No rash, lesions/masses Respiratory: No cough, dyspnea Urologic: No hematuria, dysuria Abdominal:   No nausea, vomiting, diarrhea, bright red blood per rectum, melena, or hematemesis Neurologic:  No visual changes, wkns, changes in mental status. All other systems reviewed and are otherwise negative except as noted above.  Physical Exam  Blood pressure 145/85, pulse 80, temperature 98.4 F (36.9 C), temperature source Oral, resp. rate 29, height 5\' 8"  (1.727 m), weight 465 lb 1.6 oz (210.968 kg), SpO2 95 %.  General: Pleasant, NAD Psych: Normal affect. Neuro: Alert and oriented X 3. Moves all extremities spontaneously. HEENT: Normal  Neck: Supple without bruits .  Very thick neck.  Difficult to see jugular venous pressure Lungs:  Bibasilar rales and decreased breath sounds at bases.  Poor inspiratory effort. Heart: RRR no s3, s4, or murmurs. Abdomen: Soft, non-tender, non-distended, BS + x 4.  Extremities: Marked chronic edema bilaterally.  Pedal pulses are present  Labs   Recent Labs  10/29/14 1535 10/29/14 1948 10/30/14 0102    TROPONINI <0.03 <0.03 <0.03   Lab Results  Component Value Date   WBC 7.1 10/29/2014   HGB 16.7 10/29/2014   HCT 52.0 10/29/2014   MCV 79.8 10/29/2014   PLT 247 10/29/2014     Recent Labs Lab 10/29/14 0630  NA 140  K 4.6  CL 98*  CO2 32  BUN 8  CREATININE 1.12  CALCIUM 9.0  GLUCOSE 202*   Lab Results  Component Value Date   CHOL 151 10/30/2014   HDL 34* 10/30/2014   LDLCALC 103* 10/30/2014   TRIG 70 10/30/2014   No results found for: DDIMER  Radiology/Studies  Ct Angio Chest Pe W/cm &/or Wo Cm  10/29/2014   CLINICAL DATA:  Hypoxia and history of sarcoidosis.  EXAM: CT ANGIOGRAPHY CHEST WITH CONTRAST  TECHNIQUE: Multidetector CT imaging of the chest was performed using the standard protocol during bolus administration of intravenous contrast. Multiplanar CT image reconstructions and MIPs were obtained to evaluate the vascular anatomy.  CONTRAST:  OMNIPAQUE IOHEXOL 350 MG/ML SOLN  COMPARISON:  Chest x-ray today.  FINDINGS: Lungs are adequately inflated and demonstrate mild hazy bilateral perihilar opacification likely mild interstitial edema. No evidence of pleural effusion. Subtle linear atelectasis over the right middle lobe. Airway is within normal.  Prominent overlying soft tissues causing mild image degradation. Mild to moderate cardiomegaly. Although timing of contrast bolus appears adequate, pulmonary arterial opacification is not optimal as there are no significant emboli detected. 1 cm prevascular lymph node as well as 1.4 cm right subcarinal lymph node which may be due to patient's known sarcoid. No significant hilar or axillary adenopathy.  Images through the upper abdomen are within normal. There is mild degenerate change of the spine.  Review of the MIP images confirms the above findings.  IMPRESSION: No evidence of pulmonary emboli.  Cardiomegaly with bilateral hazy perihilar opacification likely mild interstitial edema.  Minimal mediastinal adenopathy likely  related to patient's known sarcoid.   Electronically Signed   By: Elberta Fortis M.D.   On: 10/29/2014 10:46   Dg Chest Port 1 View  10/29/2014   CLINICAL DATA:  Chest pain and shortness of breath.  EXAM: PORTABLE CHEST - 1 VIEW  COMPARISON:  06/19/2012.  FINDINGS: Trachea is midline. Heart is enlarged. Diffuse mixed interstitial and airspace opacification. Small bilateral effusions. Pleural thickening in the lateral hemithoraces is unchanged and likely due to extrapleural fat.  IMPRESSION: Congestive heart failure. Likely underlying changes of sarcoid in this patient with a known history.   Electronically Signed   By: Leanna Battles M.D.   On: 10/29/2014 07:12    ECG  30-Oct-2014 05:42:50 [facility] ROUTINE RECORD Normal sinus rhythm Left axis deviation Abnormal ECG Personally reviewed  2-D echo: - Left ventricle: The study is very limited. Contrast does help. LV function is normal. The cavity size was normal. Wall thickness was increased in a pattern of mild LVH. Wall motion was normal; there were no regional  wall motion abnormalities. - Left atrium: The atrium was mildly dilated. - Right ventricle: RV is poorly seen. TAPSE data is not reliable. The contrast images suggest that there is RV dysfunction.  ASSESSMENT AND PLAN  1.  Acute diastolic heart failure, improving on Lasix.  Dyspnea has improved. 2.  Obstructive sleep apnea/obesity hypoventilation syndrome 3.  History of pulmonary sarcoidosis. 4.  Tobacco use disorder  Recommendation: No indication to pursue additional ischemic workup at this point.  His left ventricular systolic function is normal without any regional wall motion abnormalities.  Troponins are negative. Continue with risk factor modification, weight loss is imperative.  Smoking cessation. Continue diuresis. Cardiology will sign off.  Please call for questions.  Karie Schwalbe MD   10/30/2014, 8:31 AM

## 2014-10-31 DIAGNOSIS — E662 Morbid (severe) obesity with alveolar hypoventilation: Secondary | ICD-10-CM

## 2014-10-31 DIAGNOSIS — G4733 Obstructive sleep apnea (adult) (pediatric): Secondary | ICD-10-CM

## 2014-10-31 LAB — BASIC METABOLIC PANEL
Anion gap: 8 (ref 5–15)
BUN: 5 mg/dL — AB (ref 6–20)
CHLORIDE: 96 mmol/L — AB (ref 101–111)
CO2: 34 mmol/L — ABNORMAL HIGH (ref 22–32)
CREATININE: 0.81 mg/dL (ref 0.61–1.24)
Calcium: 8.6 mg/dL — ABNORMAL LOW (ref 8.9–10.3)
GFR calc Af Amer: >60 mL/min (ref >60)
GFR calc non Af Amer: >60 mL/min (ref >60)
Glucose, Bld: 87 mg/dL (ref 65–99)
POTASSIUM: 4.5 mmol/L (ref 3.5–5.1)
Sodium: 138 mmol/L (ref 135–145)

## 2014-10-31 LAB — GLUCOSE, CAPILLARY
GLUCOSE-CAPILLARY: 108 mg/dL — AB (ref 65–99)
Glucose-Capillary: 99 mg/dL (ref 65–99)

## 2014-10-31 MED ORDER — BLOOD GLUCOSE MONITOR KIT: PACK | Status: AC

## 2014-10-31 MED ORDER — LISINOPRIL 5 MG PO TABS: 5.0000 mg | ORAL_TABLET | Freq: Every day | ORAL | Status: DC

## 2014-10-31 MED ORDER — LIVING WELL WITH DIABETES BOOK
Freq: Once | Status: AC
  Administered 2014-10-31: 11:00:00
  Filled 2014-10-31: qty 1

## 2014-10-31 MED ORDER — METFORMIN HCL 500 MG PO TABS: 500.0000 mg | ORAL_TABLET | Freq: Two times a day (BID) | ORAL | Status: DC

## 2014-10-31 MED ORDER — ASPIRIN 81 MG PO TBEC: 81.0000 mg | DELAYED_RELEASE_TABLET | Freq: Every day | ORAL | Status: AC

## 2014-10-31 NOTE — Discharge Summary (Signed)
[facility] Summary  Patient name: Stephen Conley Medical record number: [id number] Date of birth: 10/08/1980 Age: 34 y.o. Gender: male Date of Admission: 10/29/2014  Date of Discharge: 10/31/14 Admitting Physician: Tobey GrimJeffrey H Walden, MD  Primary Care Provider: No primary care provider on file. Consultants: cardiology, pulmonology  Indication for Hospitalization: SOB, hypoxia  Discharge Diagnoses/Problem List:  Hypoxia New onset T2DM HTN Obesity hypoventilation syndrome Obstructive sleep apnea  Disposition: Discharge home  Discharge Condition: Stable/Improved  Discharge Exam:  Temp: [97.6 F (36.4 C)-98.5 F (36.9 C)] 97.6 F (36.4 C) (05/27 0848) Pulse Rate: [78-88] 78 (05/27 0848) Resp: [17-38] 20 (05/27 0848) BP: (141-178)/(77-97) 152/77 mmHg (05/27 0848) SpO2: [92 %-94 %] 94 % (05/27 0848) Weight: [466 lb 8 oz (211.603 kg)] 466 lb 8 oz (211.603 kg) (05/27 0400)  I/O last 3 completed shifts: In: 1512.5 [P.O.:1320; I.V.:192.5] Out: 7700 [Urine:7700] Total I/O In: 340 [P.O.:340] Out: -   Physical Exam: General: awake, alert, obese male, NAD HEENT: [place]/AT, EOMI, normal Cardiovascular: RRR, no murmurs, +2 radial pulses Respiratory: breath sounds difficult to auscultate 2/2 body habitus, but no wheeze or crackles appreciated on exam, no increased WOB, [place] in place @3L  Abdomen: obese, NT/ND, +BS Extremities: Warm, hyperpigmentation to shins, 2+ edema to mid shins, 2+ edema on posterior thighs b/l Neuro: follows commands, no focal deficits Skin: as above  Brief Hospital Course:  Stephen Conley is a 34 y.o. male that presented with chest pain and dypsnea. PMH is significant for cutaneous sarcoidosis and morbid obesity.  In the ED, patient was saturating in the 70's, requiring 15L nonrebreather.  Initial ABG with pH 7.28.  CXR with evidence of CHF.  CTA neg for PE.  BPs 200/120's.  Patient was placed on a nitroglycerin  drip and admitted to FPTS for BP control in the setting of acute CHF exacerbation.  EKG sinus with no ischemic changes.  Troponins neg x3, lactic acid initially 2.17 and trended down to 1.15. BNP 89, though patient morbidly obese.  Patient was diuresed with IV Lasix, to which he responded well diuresing almost 7L in the first 24 hours.  He oxygen requirement decreased to 3L O2.  Patient was evaluated by cardiology, who recommended continued diuresis.  2D Echo with limited results but showing LVH, mild dilation of LA, RV poorly seen but suggestive of RV dysfunction. EKG NSR with L axis deviation. Patient was evaluated by pulmonology in the setting of known sarcoid.  They believed oxygen requirement to be from hypercarbic respiratory failure 2/2 morbid obesity/OSA/OHS in addition to the pulmonary edema for which he was admitted.  No additional inpatient recommendations but outpatient follow up was recommended.  Patient was also treated with Bi-pap during hospitalization, which he tolerated well.  Ambulating oxygen saturation was obtained which showed O2 73% on RA with ambulation.  A sleep study, bi-pap and home O2 were arranged for patient before discharge.  Additionally, patient had risk stratification labs ordered in the setting of acute chest pain.  He was found to be diabetic with A1c 7.2.  He was treated with SSI during hospitalization and discharged with metformin 500mg  BID and glucometer  Lifestyle modifications were encouraged, including smoking cessation and weight loss.  Patient was assessed by a dietician who also provided resources for diet modification.  Patient also started on Lisinopril 5mg .  Patient was discharged in stable condition.  He is to follow up with pulmonology outpatient.  He already had an appointment to establish with [facility] and  was encouraged to go to this appointment.  Issues for Follow Up:  1. Recommend starting statin, new dx of T2DM 2. Recommend repeat A1c in 3  months 3. Continued weight loss and smoking cessation counseling 4. F/U sleep study, ordered for outpatient split study 5. F/U BP  Significant Procedures: none  Significant Labs and Imaging:   Recent Labs Lab 10/29/14 0630  WBC 7.1  HGB 16.7  HCT 52.0  PLT 247    Recent Labs Lab 10/29/14 0630 10/30/14 0102 10/31/14 0555  NA 140 141 138  K 4.6 4.2 4.5  CL 98* 102 96*  CO2 32 34* 34*  GLUCOSE 202* 117* 87  BUN 8 5* 5*  CREATININE 1.12 0.99 0.81  CALCIUM 9.0 8.4* 8.6*   Cardiac Panel (last 3 results)  Recent Labs  10/29/14 1535 10/29/14 1948 10/30/14 0102  TROPONINI <0.03 <0.03 <0.03   Lipid Panel     Component Value Date/Time   CHOL 151 10/30/2014 0102   TRIG 70 10/30/2014 0102   HDL 34* 10/30/2014 0102   CHOLHDL 4.4 10/30/2014 0102   VLDL 14 10/30/2014 0102   LDLCALC 103* 10/30/2014 0102   Ct Angio Chest Pe W/cm &/or Wo Cm  10/29/2014   CLINICAL DATA:  Hypoxia and history of sarcoidosis.  EXAM: CT ANGIOGRAPHY CHEST WITH CONTRAST  TECHNIQUE: Multidetector CT imaging of the chest was performed using the standard protocol during bolus administration of intravenous contrast. Multiplanar CT image reconstructions and MIPs were obtained to evaluate the vascular anatomy.  CONTRAST:  OMNIPAQUE IOHEXOL 350 MG/ML SOLN  COMPARISON:  Chest x-ray today.  FINDINGS: Lungs are adequately inflated and demonstrate mild hazy bilateral perihilar opacification likely mild interstitial edema. No evidence of pleural effusion. Subtle linear atelectasis over the right middle lobe. Airway is within normal.  Prominent overlying soft tissues causing mild image degradation. Mild to moderate cardiomegaly. Although timing of contrast bolus appears adequate, pulmonary arterial opacification is not optimal as there are no significant emboli detected. 1 cm prevascular lymph node as well as 1.4 cm right subcarinal lymph node which may be due to patient's known sarcoid. No significant hilar or  axillary adenopathy.  Images through the upper abdomen are within normal. There is mild degenerate change of the spine.  Review of the MIP images confirms the above findings.  IMPRESSION: No evidence of pulmonary emboli.  Cardiomegaly with bilateral hazy perihilar opacification likely mild interstitial edema.  Minimal mediastinal adenopathy likely related to patient's known sarcoid.   Electronically Signed   By: Elberta Fortis M.D.   On: 10/29/2014 10:46   Dg Chest Port 1 View  10/29/2014   CLINICAL DATA:  Chest pain and shortness of breath.  EXAM: PORTABLE CHEST - 1 VIEW  COMPARISON:  06/19/2012.  FINDINGS: Trachea is midline. Heart is enlarged. Diffuse mixed interstitial and airspace opacification. Small bilateral effusions. Pleural thickening in the lateral hemithoraces is unchanged and likely due to extrapleural fat.  IMPRESSION: Congestive heart failure. Likely underlying changes of sarcoid in this patient with a known history.   Electronically Signed   By: Leanna Battles M.D.   On: 10/29/2014 07:12   2D echo: Study Conclusions - Left ventricle: The study is very limited. Contrast does help. LV function is normal. The cavity size was normal. Wall thickness was increased in a pattern of mild LVH. Wall motion was normal; there were no regional wall motion abnormalities. - Left atrium: The atrium was mildly dilated. - Right ventricle: RV is poorly seen. TAPSE  data is not reliable. The contrast images suggest that there is RV dysfunction  Results/Tests Pending at Time of Discharge: none  Discharge Medications:    Medication List    STOP taking these medications        hydroxychloroquine 200 MG tablet  Commonly known as:  PLAQUENIL      TAKE these medications        aspirin 81 MG EC tablet  Take 1 tablet (81 mg total) by mouth daily.     lisinopril 5 MG tablet  Commonly known as:  PRINIVIL,ZESTRIL  Take 1 tablet (5 mg total) by mouth daily.     metFORMIN 500 MG tablet   Commonly known as:  GLUCOPHAGE  Take 1 tablet (500 mg total) by mouth 2 (two) times daily with a meal.        Discharge Instructions: Please refer to Patient Instructions section of EMR for full details.  Patient was counseled important signs and symptoms that should prompt return to medical care, changes in medications, dietary instructions, activity restrictions, and follow up appointments.   Follow-Up Appointments: Follow-up Information    Follow up with Jeanine Luz, FNP. Go on 11/07/2014.   Specialty:  Family Medicine   Why:  new patient/hospital follow up   Contact information:   [address] [place] [address] [phone]       Raliegh Ip, DO 10/31/2014, 9:56 AM PGY-1, [facility]

## 2014-10-31 NOTE — Progress Notes (Signed)
Rounding Note:   Saw patient in relation to his new diagnosis of DM 2. A1c 7.2%. Patient reports several of his family members has DM. Spoke with pt about new diabetes diagnosis. Discussed A1C results and explained what an A1C is, basic pathophysiology of DM Type 2, basic home care, importance of checking CBGs and maintaining good CBG control to prevent long-term and short-term complications. Patient is wanting to check his glucose at home. Will need meter at discharge RN to page MD. Reviewed signs and symptoms of hyperglycemia and hypoglycemia along with treatment for both. Went over his discharge DM medication metformin, Discussed possible side effects and told him to take with food. 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 Gave patient diabetes meal planning guide with specific carb measurements for specific foods. RNs to provide ongoing basic DM education at bedside with this patient and engage patient to actively check blood glucose. Have ordered educational booklet and Diabetes education videos. Patient watching videos when I arrived. Patient does not have any questions at this time related to his new DM diagnosis.   Thanks,  Christena DeemShannon Dajon Rowe RN, MSN, [facility] In[facility] Diabetes Coordinator Team Pager [phone]

## 2014-10-31 NOTE — Care Management (Signed)
1455 10-31-14 CM did fax [facility]. Appointment placed on AVS. No further needs from CM at this time. Gala LewandowskyGraves-Bigelow, Sayvon Arterberry Kaye, RN,BSN [phone]

## 2014-10-31 NOTE — Discharge Instructions (Signed)
You were admitted for chest pain and shortness of breath.  You were found to have pulmonary edema that was treated with a diuretic.  You were also found to have a new oxygen requirement.  For this reason, you are being discharged with home oxygen.  You were also found to have new onset type 2 diabetes.  You are being discharged with 2 new medications.  One is called Metformin.  This will help with your blood sugars.  You may experience some gastrointestinal upset with this medication.  This should resolve in about 2-3 weeks.  Please continue to take this medication while your body adjusts to it.  You are also being discharged with a medication called Lisinopril to health protect your kidneys and lower your blood pressure.  In addition, you have sleep apnea.  Sleep apnea can cause you to have increased blood pressures and difficulty losing weight.  A BiPap machine and sleep study have been ordered for you.  Weight loss will be key to helping you blood pressure and sugars normalize.  You already have an appointment scheduled with  healthcare to establish as a patient.  Please make sure that you keep this appointment and discuss your hospitalization with them.    Hypertension Hypertension, commonly called high blood pressure, is when the force of blood pumping through your arteries is too strong. Your arteries are the blood vessels that carry blood from your heart throughout your body. A blood pressure reading consists of a higher number over a lower number, such as 110/72. The higher number (systolic) is the pressure inside your arteries when your heart pumps. The lower number (diastolic) is the pressure inside your arteries when your heart relaxes. Ideally you want your blood pressure below 120/80. Hypertension forces your heart to work harder to pump blood. Your arteries may become narrow or stiff. Having hypertension puts you at risk for heart disease, stroke, and other problems.  RISK FACTORS Some  risk factors for high blood pressure are controllable. Others are not.  Risk factors you cannot control include:   Race. You may be at higher risk if you are African American.  Age. Risk increases with age.  Gender. Men are at higher risk than women before age 56 years. After age 106, women are at higher risk than men. Risk factors you can control include: 1. Not getting enough exercise or physical activity. 2. Being overweight. 3. Getting too much fat, sugar, calories, or salt in your diet. 4. Drinking too much alcohol. SIGNS AND SYMPTOMS Hypertension does not usually cause signs or symptoms. Extremely high blood pressure (hypertensive crisis) may cause headache, anxiety, shortness of breath, and nosebleed. DIAGNOSIS  To check if you have hypertension, your health care provider will measure your blood pressure while you are seated, with your arm held at the level of your heart. It should be measured at least twice using the same arm. Certain conditions can cause a difference in blood pressure between your right and left arms. A blood pressure reading that is higher than normal on one occasion does not mean that you need treatment. If one blood pressure reading is high, ask your health care provider about having it checked again. TREATMENT  Treating high blood pressure includes making lifestyle changes and possibly taking medicine. Living a healthy lifestyle can help lower high blood pressure. You may need to change some of your habits. Lifestyle changes may include:  Following the DASH diet. This diet is high in fruits, vegetables, and whole  grains. It is low in salt, red meat, and added sugars.  Getting at least 2 hours of brisk physical activity every week.  Losing weight if necessary.  Not smoking.  Limiting alcoholic beverages.  Learning ways to reduce stress. If lifestyle changes are not enough to get your blood pressure under control, your health care provider may prescribe  medicine. You may need to take more than one. Work closely with your health care provider to understand the risks and benefits. HOME CARE INSTRUCTIONS  Have your blood pressure rechecked as directed by your health care provider.   Take medicines only as directed by your health care provider. Follow the directions carefully. Blood pressure medicines must be taken as prescribed. The medicine does not work as well when you skip doses. Skipping doses also puts you at risk for problems.   Do not smoke.   Monitor your blood pressure at home as directed by your health care provider. SEEK MEDICAL CARE IF:   You think you are having a reaction to medicines taken.  You have recurrent headaches or feel dizzy.  You have swelling in your ankles.  You have trouble with your vision. SEEK IMMEDIATE MEDICAL CARE IF:  You develop a severe headache or confusion.  You have unusual weakness, numbness, or feel faint.  You have severe chest or abdominal pain.  You vomit repeatedly.  You have trouble breathing. MAKE SURE YOU:   Understand these instructions.  Will watch your condition.  Will get help right away if you are not doing well or get worse. Document Released: 05/23/2005 Document Revised: 10/07/2013 Document Reviewed: 03/15/2013 [facility] Patient Information 2015 [place], [place]. This information is not intended to replace advice given to you by your health care provider. Make sure you discuss any questions you have with your health care provider.  Oxygen Use at Home Oxygen can be prescribed for home use. The prescription will show the flow rate. This is how much oxygen is to be used per minute. This will be listed in liters per minute (LPM or L/M). A liter is a metric measurement of volume. You will use oxygen therapy as directed. It can be used while exercising, sleeping, or at rest. You may need oxygen continuously. Your health care provider may order a blood oxygen test (arterial  blood gas or pulse oximetry test) that will show what your oxygen level is. Your health care provider will use these measurements to learn about your needs and follow your progress. Home oxygen therapy is commonly used on patients with various lung (pulmonary) related conditions. Some of these conditions include:  Asthma.  Lung cancer.  Pneumonia.  Emphysema.  Chronic bronchitis.  Cystic fibrosis.  Other lung diseases.  Pulmonary fibrosis.  Occupational lung disease.  Heart failure.  Chronic obstructive pulmonary disease (COPD). 3 COMMON WAYS OF PROVIDING OXYGEN THERAPY 5. Gas: The gas form of oxygen is put into variously sized cylinders or tanks. The cylinders or oxygen tanks contain compressed oxygen. The cylinder is equipped with a regulator that controls the flow rate. Because the flow of oxygen out of the cylinder is constant, an oxygen conserving device may be attached to the system to avoid waste. This device releases the gas only when you inhale and cuts it off when you exhale. Oxygen can be provided in a small cylinder that can be carried with you. Large tanks are heavy and are only for stationary use. After use, empty tanks must be exchanged for full tanks. 6. Liquid: The liquid  form of oxygen is put into a container similar to a thermos. When released, the liquid converts to a gas and you breathe it in just like the compressed gas. This storage method takes up less space than the compressed gas cylinder, and you can transfer the liquid to a small, portable vessel at home. Liquid oxygen is more expensive than the compressed gas, and the vessel vents when not in use. An oxygen conserving device may be built into the vessel to conserve the oxygen. Liquid oxygen is very cold, around 297 below zero. 7. Oxygen concentrator: This medical device filters oxygen from room air and gives almost 100% oxygen to the patient. Oxygen concentrators are powered by electricity. Benefits of this  system are: 1. It does not need to be resupplied. 2. It is not as costly as liquid oxygen. 3. Extra tubing permits the user to move around easier. There are several types of small, portable oxygen systems available which can help you remain active and mobile. You must have a cylinder of oxygen as a backup in the event of a power failure. Advise your electric power company that you are on oxygen therapy in order to get priority service when there is a power failure. OXYGEN DELIVERY DEVICES There are 3 common ways to deliver oxygen to your body.  Nasal cannula. This is a 2-pronged device inserted in the nostrils that is connected to tubing carrying the oxygen. The tubing can rest on the ears or be attached to the frame of eyeglasses.  Mask. People who need a high flow of oxygen generally use a mask.  Transtracheal catheter. Transtracheal oxygen therapy requires the insertion of a small, flexible tube (catheter) in the windpipe (trachea). This catheter is held in place by a necklace. Since transtracheal oxygen bypasses the mouth, nose, and throat, a humidifier is absolutely required at flow rates of 1 LPM or greater. OXYGEN USE SAFETY TIPS  Never smoke while using oxygen. Oxygen does not burn or explode, but flammable materials will burn faster in the presence of oxygen.  Keep a Government social research officer close by. Let your fire department know that you have oxygen in your home.  Warn visitors not to smoke near you when you are using oxygen. Put up "no smoking" signs in your home where you most often use the oxygen.  When you go to a restaurant with your portable oxygen source, ask to be seated in the nonsmoking section.  Stay at least 5 feet away from gas stoves, candles, lighted fireplaces, or other heat sources.  Do not use materials that burn easily (flammable) while using your oxygen.  If you use an oxygen cylinder, make sure it is secured to some fixed object or in a stand. If you use liquid  oxygen, make sure the vessel is kept upright to keep the oxygen from pouring out. Liquid oxygen is so cold it can hurt your skin.  If you use an oxygen concentrator, call your electric company so you will be given priority service if your power goes out. Avoid using extension cords, if possible.  Regularly test your smoke detectors at home to make sure they work. If you receive care in your home from a nurse or other health care provider, he or she may also check to make sure your smoke detectors work. GUIDELINES FOR CLEANING YOUR EQUIPMENT  Wash the nasal prongs with a liquid soap. Thoroughly rinse them once or twice a week.  Replace the prongs every 2 to 4 weeks. If  you have an infection (cold, pneumonia) change them when you are well.  Your health care provider will give you instructions on how to clean your transtracheal catheter.  The humidifier bottle should be washed with soap and warm water and rinsed thoroughly between each refill. Air-dry the bottle before filling it with sterile or distilled water. The bottle and its top should be disinfected after they are cleaned.  If you use an oxygen concentrator, unplug the unit. Then wipe down the cabinet with a damp cloth and dry it daily. The air filter should be cleaned at least twice a week.  Follow your home medical equipment and service company's directions for cleaning the compressor filter. HOME CARE INSTRUCTIONS   Do not change the flow of oxygen unless directed by your health care provider.  Do not use alcohol or other sedating drugs unless instructed. They slow your breathing rate.  Do not use materials that burn easily (flammable) while using your oxygen.  Always keep a spare tank of oxygen. Plan ahead for holidays when you may not be able to get a prescription filled.  Use water-based lubricants on your lips or nostrils. Do not use an oil-based product like petroleum jelly.  To prevent your cheeks or the skin behind your  ears from becoming irritated, tuck some gauze under the tubing.  If you have persistent redness under your nose, call your health care provider.  When you no longer need oxygen, your doctor will have the oxygen discontinued. Oxygen is not addicting or habit forming.  Use the oxygen as instructed. Too much oxygen can be harmful and too little will not give you the benefit you need.  Shortness of breath is not always from a lack of oxygen. If your oxygen level is not the cause of your shortness of breath, taking oxygen will not help. SEEK MEDICAL CARE IF:   You have frequent headaches.  You have shortness of breath or a lasting cough.  You have anxiety.  You are confused.  You are drowsy or sleepy all the time.  You develop an illness which aggravates your breathing.  You cannot exercise.  You are restless.  You have blue lips or fingernails.  You have difficult or irregular breathing and it is getting worse.  You have a fever. Document Released: 08/13/2003 Document Revised: 10/07/2013 Document Reviewed: 01/02/2013 [facility] Patient Information 2015 [place], [place]. This information is not intended to replace advice given to you by your health care provider. Make sure you discuss any questions you have with your health care provider.  Pulmonary Edema Pulmonary edema is abnormal fluid buildup in the lungs that can make it hard to breathe. HOME CARE  Talk to your doctor about an exercise program.  Eat a healthy diet:  Eat fresh fruits, vegetables, and lean meats.  Limit high fat and salty foods.  Avoid processed, canned, or fried foods.  Avoid fast food.  Follow your doctor's advice about taking medicine and recording the medicine you take.  Follow your doctor's advice about keeping a record of your weight.  Talk to your doctor about keeping track of your blood pressure.  Do not smoke.  Do not use nicotine patches or nicotine gum.  Make a follow-up appointment  with your doctor.  Ask your doctor for a copy of your latest heart tracing (ECG) and keep a copy with you at all times. GET HELP RIGHT AWAY IF:  8. You have chest pain. THIS IS AN EMERGENCY. Do not wait to  see if the pain will go away. Call for local emergency medical help. Do not drive yourself to the hospital. 9. You have sweating, feel sick to your stomach (nauseous), or are experiencing shortness of breath. 10. Your weight increases more than your doctor tells you it should. 11. You start to have shortness of breath. 12. You notice more swelling in your hands, feet, ankles, or belly. 13. You have dizziness, blurred vision, headache, or unsteadiness that does not go away. 14. You cough up bloody spit. 15. You have a cough that does not go away. 16. You are unable to sleep because it is hard to breathe. 17. You begin to feel a "jumping" or "fluttering" sensation (palpitations) in the chest that is unusual for you. MAKE SURE YOU:   Understand these instructions.  Will watch your condition.  Will get help right away if you are not doing well or get worse. Document Released: 05/11/2009 Document Revised: 05/28/2013 Document Reviewed: 01/28/2013 [facility] Patient Information 2015 [place], [place]. This information is not intended to replace advice given to you by your health care provider. Make sure you discuss any questions you have with your health care provider. Type 2 Diabetes Mellitus Type 2 diabetes mellitus, often simply referred to as type 2 diabetes, is a long-lasting (chronic) disease. In type 2 diabetes, the pancreas does not make enough insulin (a hormone), the cells are less responsive to the insulin that is made (insulin resistance), or both. Normally, insulin moves sugars from food into the tissue cells. The tissue cells use the sugars for energy. The lack of insulin or the lack of normal response to insulin causes excess sugars to build up in the blood instead of going into the  tissue cells. As a result, high blood sugar (hyperglycemia) develops. The effect of high sugar (glucose) levels can cause many complications. Type 2 diabetes was also previously called adult-onset diabetes, but it can occur at any age.  RISK FACTORS  A person is predisposed to developing type 2 diabetes if someone in the family has the disease and also has one or more of the following primary risk factors:  Overweight.  An inactive lifestyle.  A history of consistently eating high-calorie foods. Maintaining a normal weight and regular physical activity can reduce the chance of developing type 2 diabetes. SYMPTOMS  A person with type 2 diabetes may not show symptoms initially. The symptoms of type 2 diabetes appear slowly. The symptoms include: 18. Increased thirst (polydipsia). 19. Increased urination (polyuria). 20. Increased urination during the night (nocturia). 21. Weight loss. This weight loss may be rapid. 22. Frequent, recurring infections. 23. Tiredness (fatigue). 24. Weakness. 25. Vision changes, such as blurred vision. 26. Fruity smell to your breath. 27. Abdominal pain. 28. Nausea or vomiting. 29. Cuts or bruises which are slow to heal. 30. Tingling or numbness in the hands or feet. DIAGNOSIS Type 2 diabetes is frequently not diagnosed until complications of diabetes are present. Type 2 diabetes is diagnosed when symptoms or complications are present and when blood glucose levels are increased. Your blood glucose level may be checked by one or more of the following blood tests:  A fasting blood glucose test. You will not be allowed to eat for at least 8 hours before a blood sample is taken.  A random blood glucose test. Your blood glucose is checked at any time of the day regardless of when you ate.  A hemoglobin A1c blood glucose test. A hemoglobin A1c test provides information about blood  glucose control over the previous 3 months.  An oral glucose tolerance test  (OGTT). Your blood glucose is measured after you have not eaten (fasted) for 2 hours and then after you drink a glucose-containing beverage. TREATMENT   You may need to take insulin or diabetes medicine daily to keep blood glucose levels in the desired range.  If you use insulin, you may need to adjust the dosage depending on the carbohydrates that you eat with each meal or snack. The treatment goal is to maintain the before meal blood sugar (preprandial glucose) level at 70-130 mg/dL. HOME CARE INSTRUCTIONS   Have your hemoglobin A1c level checked twice a year.  Perform daily blood glucose monitoring as directed by your health care provider.  Monitor urine ketones when you are ill and as directed by your health care provider.  Take your diabetes medicine or insulin as directed by your health care provider to maintain your blood glucose levels in the desired range.  Never run out of diabetes medicine or insulin. It is needed every day.  If you are using insulin, you may need to adjust the amount of insulin given based on your intake of carbohydrates. Carbohydrates can raise blood glucose levels but need to be included in your diet. Carbohydrates provide vitamins, minerals, and fiber which are an essential part of a healthy diet. Carbohydrates are found in fruits, vegetables, whole grains, dairy products, legumes, and foods containing added sugars.  Eat healthy foods. You should make an appointment to see a registered dietitian to help you create an eating plan that is right for you.  Lose weight if you are overweight.  Carry a medical alert card or wear your medical alert jewelry.  Carry a 15-gram carbohydrate snack with you at all times to treat low blood glucose (hypoglycemia). Some examples of 15-gram carbohydrate snacks include:  Glucose tablets, 3 or 4.  Glucose gel, 15-gram tube.  Raisins, 2 tablespoons (24 grams).  Jelly beans, 6.  Animal crackers, 8.  Regular pop, 4  ounces (120 mL).  Gummy treats, 9.  Recognize hypoglycemia. Hypoglycemia occurs with blood glucose levels of 70 mg/dL and below. The risk for hypoglycemia increases when fasting or skipping meals, during or after intense exercise, and during sleep. Hypoglycemia symptoms can include:  Tremors or shakes.  Decreased ability to concentrate.  Sweating.  Increased heart rate.  Headache.  Dry mouth.  Hunger.  Irritability.  Anxiety.  Restless sleep.  Altered speech or coordination.  Confusion.  Treat hypoglycemia promptly. If you are alert and able to safely swallow, follow the 15:15 rule:  Take 15-20 grams of rapid-acting glucose or carbohydrate. Rapid-acting options include glucose gel, glucose tablets, or 4 ounces (120 mL) of fruit juice, regular soda, or low-fat milk.  Check your blood glucose level 15 minutes after taking the glucose.  Take 15-20 grams more of glucose if the repeat blood glucose level is still 70 mg/dL or below.  Eat a meal or snack within 1 hour once blood glucose levels return to normal.  Be alert to feeling very thirsty and urinating more frequently than usual, which are early signs of hyperglycemia. An early awareness of hyperglycemia allows for prompt treatment. Treat hyperglycemia as directed by your health care provider.  Engage in at least 150 minutes of moderate-intensity physical activity a week, spread over at least 3 days of the week or as directed by your health care provider. In addition, you should engage in resistance exercise at least 2 times a week  or as directed by your health care provider. Try to spend no more than 90 minutes at one time inactive.  Adjust your medicine and food intake as needed if you start a new exercise or sport.  Follow your sick-day plan anytime you are unable to eat or drink as usual.  Do not use any tobacco products including cigarettes, chewing tobacco, or electronic cigarettes. If you need help quitting,  ask your health care provider.  Limit alcohol intake to no more than 1 drink per day for nonpregnant women and 2 drinks per day for men. You should drink alcohol only when you are also eating food. Talk with your health care provider whether alcohol is safe for you. Tell your health care provider if you drink alcohol several times a week.  Keep all follow-up visits as directed by your health care provider. This is important.  Schedule an eye exam soon after the diagnosis of type 2 diabetes and then annually.  Perform daily skin and foot care. Examine your skin and feet daily for cuts, bruises, redness, nail problems, bleeding, blisters, or sores. A foot exam by a health care provider should be done annually.  Brush your teeth and gums at least twice a day and floss at least once a day. Follow up with your dentist regularly.  Share your diabetes management plan with your workplace or school.  Stay up-to-date with immunizations. It is recommended that people with diabetes who are over 35 years old get the pneumonia vaccine. In some cases, two separate shots may be given. Ask your health care provider if your pneumonia vaccination is up-to-date.  Learn to manage stress.  Obtain ongoing diabetes education and support as needed.  Participate in or seek rehabilitation as needed to maintain or improve independence and quality of life. Request a physical or occupational therapy referral if you are having foot or hand numbness, or difficulties with grooming, dressing, eating, or physical activity. SEEK MEDICAL CARE IF:   You are unable to eat food or drink fluids for more than 6 hours.  You have nausea and vomiting for more than 6 hours.  Your blood glucose level is over 240 mg/dL.  There is a change in mental status.  You develop an additional serious illness.  You have diarrhea for more than 6 hours.  You have been sick or have had a fever for a couple of days and are not getting  better.  You have pain during any physical activity.  SEEK IMMEDIATE MEDICAL CARE IF:  You have difficulty breathing.  You have moderate to large ketone levels. MAKE SURE YOU:  Understand these instructions.  Will watch your condition.  Will get help right away if you are not doing well or get worse. Document Released: 05/23/2005 Document Revised: 10/07/2013 Document Reviewed: 12/20/2011 [facility] Patient Information 2015 [place]. This information is not intended to replace advice given to you by your health care provider. Make sure you discuss any questions you have with your health care provider. Sleep Apnea  Sleep apnea is a sleep disorder characterized by abnormal pauses in breathing while you sleep. When your breathing pauses, the level of oxygen in your blood decreases. This causes you to move out of deep sleep and into light sleep. As a result, your quality of sleep is poor, and the system that carries your blood throughout your body (cardiovascular system) experiences stress. If sleep apnea remains untreated, the following conditions can develop:  High blood pressure (hypertension).  Coronary artery  disease.  Inability to achieve or maintain an erection (impotence).  Impairment of your thought process (cognitive dysfunction). There are three types of sleep apnea: 31. Obstructive sleep apnea--Pauses in breathing during sleep because of a blocked airway. 32. Central sleep apnea--Pauses in breathing during sleep because the area of the brain that controls your breathing does not send the correct signals to the muscles that control breathing. 33. Mixed sleep apnea--A combination of both obstructive and central sleep apnea. RISK FACTORS The following risk factors can increase your risk of developing sleep apnea:  Being overweight.  Smoking.  Having narrow passages in your nose and throat.  Being of older age.  Being male.  Alcohol use.  Sedative and  tranquilizer use.  Ethnicity. Among individuals younger than 35 years, African Americans are at increased risk of sleep apnea. SYMPTOMS   Difficulty staying asleep.  Daytime sleepiness and fatigue.  Loss of energy.  Irritability.  Loud, heavy snoring.  Morning headaches.  Trouble concentrating.  Forgetfulness.  Decreased interest in sex. DIAGNOSIS  In order to diagnose sleep apnea, your caregiver will perform a physical examination. Your caregiver may suggest that you take a home sleep test. Your caregiver may also recommend that you spend the night in a sleep lab. In the sleep lab, several monitors record information about your heart, lungs, and brain while you sleep. Your leg and arm movements and blood oxygen level are also recorded. TREATMENT The following actions may help to resolve mild sleep apnea:  Sleeping on your side.   Using a decongestant if you have nasal congestion.   Avoiding the use of depressants, including alcohol, sedatives, and narcotics.   Losing weight and modifying your diet if you are overweight. There also are devices and treatments to help open your airway:  Oral appliances. These are custom-made mouthpieces that shift your lower jaw forward and slightly open your bite. This opens your airway.  Devices that create positive airway pressure. This positive pressure "splints" your airway open to help you breathe better during sleep. The following devices create positive airway pressure:  Continuous positive airway pressure (CPAP) device. The CPAP device creates a continuous level of air pressure with an air pump. The air is delivered to your airway through a mask while you sleep. This continuous pressure keeps your airway open.  Nasal expiratory positive airway pressure (EPAP) device. The EPAP device creates positive air pressure as you exhale. The device consists of single-use valves, which are inserted into each nostril and held in place by  adhesive. The valves create very little resistance when you inhale but create much more resistance when you exhale. That increased resistance creates the positive airway pressure. This positive pressure while you exhale keeps your airway open, making it easier to breath when you inhale again.  Bilevel positive airway pressure (BPAP) device. The BPAP device is used mainly in patients with central sleep apnea. This device is similar to the CPAP device because it also uses an air pump to deliver continuous air pressure through a mask. However, with the BPAP machine, the pressure is set at two different levels. The pressure when you exhale is lower than the pressure when you inhale.  Surgery. Typically, surgery is only done if you cannot comply with less invasive treatments or if the less invasive treatments do not improve your condition. Surgery involves removing excess tissue in your airway to create a wider passage way. Document Released: 05/13/2002 Document Revised: 09/17/2012 Document Reviewed: 09/29/2011 ExitCare Patient Information 2015 [employer]. This information is not intended to replace advice given to you by your health care provider. Make sure you discuss any questions you have with your health care provider.

## 2014-10-31 NOTE — Progress Notes (Signed)
Family Medicine Teaching Service Daily Progress Note Intern Pager: [phone]  Patient name: Stephen Conley Medical record number: [id number] Date of birth: 20-Sep-1980 Age: 34 y.o. Gender: male  Primary Care Provider: No primary care provider on file. Consultants: pulmonology Code Status: FULL  Pt Overview and Major Events to Date:  5/25: Admission  Assessment and Plan: Stephen Conley is a 34 y.o. male presenting with chest pain and dypsnea. PMH is significant for sarcoidosis and morbid obesity.  # Dyspnea & chest pain: CP resolved. Dyspnea improving. initial O2 sats in 70's, requiring supplemental O2 with 15 L via nonrebreather.  Now on 3L O2 [place] saturating -94%.  CXR with evidence of CHF.  CTA negative for PE. S/p 3x Lasix IV 40. Trop neg x3. Echo with limited results but showing LVH, mild dilation of LA, RV poorly seen but suggestive of RV dysfunction. EKG NSR with L axis deviation  -Monitor on telemetry -Strict intake and output. Out: 4L/24 hours, Weight down 4lbs from admission -Aspirin 81 mg daily -Hydralazine IV as needed for elevated pressures -Lisinopril  -Cards recs: no further ischemic w/u.  Continue weight loss, smoking cessation, diuresis, risk factor modification.  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 Repeat ABG essentially unchanged but with pH 7.338. -c/s pulmonology: appreciate recs  - c/s dietician for morbid obesity  - BiPAP at night while in house   - ambulatory O2 saturation: 96% on 4L resting, 73% on RA, 91% on 4L.  # T2DM, new diagnosis: glucose noted to be 200 on admission.  A1c 7.2;  CBGs 106-126/24 hours - sensitive SSI while inpatient - Metformin on discharge, statin on discharge  # OSA: obesity hypoventilation versus obstructive sleep apnea versus combination of both.  ABG reveals respiratory acidosis.  - BiPAP at night while in house  -  Will need titration study as outpt for auto-BiPAP @ discharge. Paperwork in chart  # Sarcoidosis: needs outpatient pulm follow up, as well as to establish with PCP. Has appointment scheduled to establish care with PCP on June 3 at [facility].  #Tobacco dependence: smokes 2-3 cigs/day -nicotine patch PRN -smoking cessation counseling  FEN/GI: SLIV, HH diet Prophylaxis: SQ heparin  Disposition: admitted to stepdown, dispo pending further eval  Subjective:  Patient reports that he is feeling better this am.  He denies CP.  Breathing has improved.  Still requiring O2 via [place] but now at 3L.  Patient urinating well.  Denies nausea, vomiting, diaphoresis, wheeze, vision change.  Objective: Temp:  [97.6 F (36.4 C)-98.5 F (36.9 C)] 97.6 F (36.4 C) (05/27 0848) Pulse Rate:  [78-88] 78 (05/27 0848) Resp:  [17-38] 20 (05/27 0848) BP: (141-178)/(77-97) 152/77 mmHg (05/27 0848) SpO2:  [92 %-94 %] 94 % (05/27 0848) Weight:  [466 lb 8 oz (211.603 kg)] 466 lb 8 oz (211.603 kg) (05/27 0400) Physical Exam: General: awake, alert, obese male, NAD Cardiovascular: RRR, no murmurs, +2 radial pulses Respiratory: breath sounds difficult to auscultate 2/2 body habitus, but no wheeze or crackles appreciated on exam, no increased WOB, [place] in place  Abdomen: obese, NT/ND, +BS Extremities: Warm, hyperpigmentation to shins, 2+ edema to mid shins, 2+ edema on posterior thighs b/l Neuro: follows commands, no focal deficits  I/O last 3 completed shifts: In: 1512.5 [P.O.:1320; I.V.:192.5] Out: 7700 [Urine:7700] Total I/O In: 340 [P.O.:340] Out: -  Laboratory:  Recent Labs Lab 10/29/14 0630  WBC 7.1  HGB 16.7  HCT 52.0  PLT 247    Recent Labs Lab 10/29/14 0630 10/30/14 0102 10/31/14 0555  NA 140 141 138  K 4.6 4.2 4.5  CL 98* 102 96*  CO2 32 34* 34*  BUN 8 5* 5*  CREATININE 1.12 0.99 0.81  CALCIUM 9.0 8.4* 8.6*  GLUCOSE 202* 117* 87   Cardiac Panel (last 3 results)  Recent  Labs  10/29/14 1535 10/29/14 1948 10/30/14 0102  TROPONINI <0.03 <0.03 <0.03   Lipid Panel     Component Value Date/Time   CHOL 151 10/30/2014 0102   TRIG 70 10/30/2014 0102   HDL 34* 10/30/2014 0102   CHOLHDL 4.4 10/30/2014 0102   VLDL 14 10/30/2014 0102   LDLCALC 103* 10/30/2014 0102   TSH: 1.933  Imaging/Diagnostic Tests: EKG 5/26: NSR w/ Left axis deviation  2D Echo:  Study Conclusions - Left ventricle: The study is very limited. Contrast does help. LV function is normal. The cavity size was normal. Wall thickness was increased in a pattern of mild LVH. Wall motion was normal; there were no regional wall motion abnormalities. - Left atrium: The atrium was mildly dilated. - Right ventricle: RV is poorly seen. TAPSE data is not reliable. The contrast images suggest that there is RV dysfunction.  Raliegh IpAshly M Gottschalk, DO 10/31/2014, 9:17 AM PGY-1, [facility] FPTS Intern pager: [phone], text pages welcome

## 2014-10-31 NOTE — Progress Notes (Signed)
Nutrition Consult Brief Note  Received consult for new onset diabetes diet education. RD intern spoke extensively with patient yesterday about weight loss and provided weight loss education handouts. Patient was not very interested in diet education yesterday. RD spoke with patient this morning to provide diabetes diet education. Patient watching diabetes videos during RD visit. He plans to watch the videos, read over the handout RD provided ("CHO counting for people with diabetes") and call RD with any questions. No further nutrition interventions needed at this time.    Stephen CourtsKimberly Harris, RD, LDN, CNSC Pager [phone] After Hours Pager [phone]

## 2014-11-04 LAB — CULTURE, BLOOD (ROUTINE X 2)
CULTURE: NO GROWTH
Culture: NO GROWTH

## 2014-11-05 ENCOUNTER — Ambulatory Visit (HOSPITAL_BASED_OUTPATIENT_CLINIC_OR_DEPARTMENT_OTHER): Payer: 59

## 2014-11-07 ENCOUNTER — Ambulatory Visit (INDEPENDENT_AMBULATORY_CARE_PROVIDER_SITE_OTHER): Payer: 59 | Admitting: Family

## 2014-11-07 ENCOUNTER — Encounter: Payer: Self-pay | Admitting: Family

## 2014-11-07 VITALS — BP 124/86 | HR 87 | Temp 97.9°F | Resp 20 | Ht 68.0 in | Wt >= 6400 oz

## 2014-11-07 DIAGNOSIS — I509 Heart failure, unspecified: Secondary | ICD-10-CM

## 2014-11-07 DIAGNOSIS — I10 Essential (primary) hypertension: Secondary | ICD-10-CM

## 2014-11-07 DIAGNOSIS — E119 Type 2 diabetes mellitus without complications: Secondary | ICD-10-CM

## 2014-11-07 MED ORDER — LISINOPRIL 5 MG PO TABS: 5.0000 mg | ORAL_TABLET | Freq: Every day | ORAL | Status: DC

## 2014-11-07 MED ORDER — METFORMIN HCL 500 MG PO TABS: 500.0000 mg | ORAL_TABLET | Freq: Two times a day (BID) | ORAL | Status: DC

## 2014-11-07 NOTE — Assessment & Plan Note (Signed)
Newly diagnosed type 2 diabetes and started on metformin. A1c was 7.2. Indicates his blood sugars at home but returned 100- 130. Continue current dosage of metformin. Follow up in 3 months for repeat A1c. Continue to reduce starches and diet and increase walking. Diabetic eye exam was completed in March 2016.

## 2014-11-07 NOTE — Patient Instructions (Signed)
Thank you for choosing [facility].  Summary/Instructions:  Please continue taking medications as prescribed.  Your prescription(s) have been submitted to your pharmacy or been printed and provided for you. Please take as directed and contact our office if you believe you are having problem(s) with the medication(s) or have any questions.  Referrals have been made during this visit. You should expect to hear back from our schedulers in about 7-10 days in regards to establishing an appointment with the specialists we discussed.   If your symptoms worsen or fail to improve, please contact our office for further instruction, or in case of emergency go directly to the emergency room at the closest medical facility.

## 2014-11-07 NOTE — Progress Notes (Signed)
Subjective:    Patient ID: Stephen Conley, male    DOB: 05-08-1981, 34 y.o.   MRN: [id number]  Chief Complaint  Patient presents with  . Establish Care    wants to talk about trying to get on disability    HPI:  Stephen Conley is a 34 y.o. male with a PMH of congestive heart disease, hypertension, obstructive sleep apnea, type 2 diabetes, sarcoid, and morbid obesity who presents today for an office visit to establish care. His mother and sister are present at today's visit.   1.) Disability/Heart failure - Is attempting to get on disability. Indicates that he is not able to work secondary to inability to stand up, difficulty maintaining oxygen levels with movement and heart failure. Previously applied for disability in [place] about 2 years ago and was denied. He does require 2 L via nasal cannula to maintain his current oxygen saturation.    2.) Type 2 diabetes - type 2 diabetes is currently controlled with metformin. Indicates he takes his medication as prescribed and denies adverse side effects. Blood sugars at home average around 100-130.   Lab Results  Component Value Date   HGBA1C 7.2* 10/29/2014   3.) Hypertension - blood pressure is stable with current dosage of lisinopril. He takes medication as prescribed and denies adverse side effects or cough.   BP Readings from Last 3 Encounters:  11/07/14 124/86  10/31/14 163/101  11/09/12 134/74    No Known Allergies   Outpatient Prescriptions Prior to Visit  Medication Sig Dispense Refill  . aspirin EC 81 MG EC tablet Take 1 tablet (81 mg total) by mouth daily. 30 tablet 0  . blood glucose meter kit and supplies KIT Dispense based on patient and insurance preference. Use up to four times daily as directed. (FOR ICD-9 250.00, 250.01). 1 each 0  . lisinopril (PRINIVIL,ZESTRIL) 5 MG tablet Take 1 tablet (5 mg total) by mouth daily. 30 tablet 0  . metFORMIN (GLUCOPHAGE) 500 MG tablet Take 1 tablet (500 mg total) by mouth 2  (two) times daily with a meal. 60 tablet 0   No facility-administered medications prior to visit.     Past Medical History  Diagnosis Date  . Sarcoidosis, lung   . Collagen vascular disease   . Congestive heart disease   . New onset type 2 diabetes mellitus   . Essential hypertension      Past Surgical History  Procedure Laterality Date  . Tonsillectomy       Family History  Problem Relation Age of Onset  . Hypertension Mother   . Hypertension Father   . Diabetes Father   . Hypertension Sister   . Hypertension Brother   . Diabetes Paternal Aunt   . Diabetes Paternal Grandmother   . Colon cancer Paternal Grandfather      History   Social History  . Marital Status: Single    Spouse Name: N/A  . Number of Children: N/A  . Years of Education: N/A   Occupational History  . Not on file.   Social History Main Topics  . Smoking status: Former Smoker -- 0.80 packs/day for 10 years    Types: Cigarettes    Quit date: 10/09/2013  . Smokeless tobacco: Never Used  . Alcohol Use: No  . Drug Use: Yes    Special: Marijuana  . Sexual Activity: Not on file   Other Topics Concern  . Not on file   Social History Narrative     Review of  Systems  Constitutional: Negative for fever and chills.  Respiratory: Negative for cough, chest tightness and shortness of breath.   Cardiovascular: Negative for chest pain, palpitations and leg swelling.      Objective:    BP 124/86 mmHg  Pulse 87  Temp(Src) 97.9 F (36.6 C) (Oral)  Resp 20  Ht 5' 8"  (1.727 m)  Wt 455 lb (206.387 kg)  BMI 69.20 kg/m2  SpO2 88% Nursing note and vital signs reviewed.  Physical Exam  Constitutional: He is oriented to person, place, and time. He appears well-developed and well-nourished. No distress.  Severely morbidly obese male seated in the chair on 2 L nasal cannula, appears older than his stated age and is dressed appropriately for the situation. Noted to continuously doze off during the  office visit when not speaking.   Cardiovascular: Normal rate, regular rhythm, normal heart sounds and intact distal pulses.   Pulmonary/Chest: Effort normal and breath sounds normal.  Neurological: He is alert and oriented to person, place, and time.  Skin: Skin is warm and dry.  Psychiatric: He has a normal mood and affect. His behavior is normal. Judgment and thought content normal.       Assessment & Plan:   Problem List Items Addressed This Visit      Cardiovascular and Mediastinum   Congestive heart disease - Primary    Recently admitted to the hospital for shortness of breath and chest pain. Found to have cardiomegaly. Was successfully diuresed in the hospital and breathing improved with questionable heart failure. Refer to cardiology for further assessment and workup of heart failure. Continue to monitor weights at home with a goal of losing approximately 5-10% of his current body weight in the next 6 months. Interested in applying for disability secondary to his inability to stand for long periods of time and requiring oxygen 2 L.      Relevant Medications   lisinopril (PRINIVIL,ZESTRIL) 5 MG tablet   Other Relevant Orders   Ambulatory referral to Cardiology   Essential hypertension    Hypertension stable with current regimen and blood pressure remains below goal of 140/90. Continue current dosage of lisinopril.      Relevant Medications   lisinopril (PRINIVIL,ZESTRIL) 5 MG tablet     Endocrine   New onset type 2 diabetes mellitus    Newly diagnosed type 2 diabetes and started on metformin. A1c was 7.2. Indicates his blood sugars at home but returned 100- 130. Continue current dosage of metformin. Follow up in 3 months for repeat A1c. Continue to reduce starches and diet and increase walking. Diabetic eye exam was completed in March 2016.      Relevant Medications   lisinopril (PRINIVIL,ZESTRIL) 5 MG tablet   metFORMIN (GLUCOPHAGE) 500 MG tablet

## 2014-11-07 NOTE — Assessment & Plan Note (Signed)
Hypertension stable with current regimen and blood pressure remains below goal of 140/90. Continue current dosage of lisinopril.

## 2014-11-07 NOTE — Assessment & Plan Note (Signed)
Recently admitted to the hospital for shortness of breath and chest pain. Found to have cardiomegaly. Was successfully diuresed in the hospital and breathing improved with questionable heart failure. Refer to cardiology for further assessment and workup of heart failure. Continue to monitor weights at home with a goal of losing approximately 5-10% of his current body weight in the next 6 months. Interested in applying for disability secondary to his inability to stand for long periods of time and requiring oxygen 2 L.

## 2014-11-07 NOTE — Progress Notes (Signed)
Pre visit review using our clinic review tool, if applicable. No additional management support is needed unless otherwise documented below in the visit note. 

## 2014-11-14 ENCOUNTER — Inpatient Hospital Stay: Payer: 59 | Admitting: Family Medicine

## 2014-12-29 ENCOUNTER — Encounter: Payer: Self-pay | Admitting: Family

## 2015-02-09 ENCOUNTER — Ambulatory Visit: Payer: 59 | Admitting: Family

## 2015-02-13 ENCOUNTER — Ambulatory Visit: Payer: 59 | Admitting: Cardiology

## 2015-02-24 ENCOUNTER — Other Ambulatory Visit (INDEPENDENT_AMBULATORY_CARE_PROVIDER_SITE_OTHER): Payer: 59

## 2015-02-24 ENCOUNTER — Ambulatory Visit (INDEPENDENT_AMBULATORY_CARE_PROVIDER_SITE_OTHER): Payer: 59 | Admitting: Family

## 2015-02-24 ENCOUNTER — Telehealth: Payer: Self-pay | Admitting: Family

## 2015-02-24 ENCOUNTER — Encounter: Payer: Self-pay | Admitting: Family

## 2015-02-24 DIAGNOSIS — H5789 Other specified disorders of eye and adnexa: Secondary | ICD-10-CM | POA: Insufficient documentation

## 2015-02-24 DIAGNOSIS — E1165 Type 2 diabetes mellitus with hyperglycemia: Secondary | ICD-10-CM | POA: Diagnosis not present

## 2015-02-24 DIAGNOSIS — IMO0002 Reserved for concepts with insufficient information to code with codable children: Secondary | ICD-10-CM

## 2015-02-24 DIAGNOSIS — E119 Type 2 diabetes mellitus without complications: Secondary | ICD-10-CM | POA: Diagnosis not present

## 2015-02-24 DIAGNOSIS — H578 Other specified disorders of eye and adnexa: Secondary | ICD-10-CM

## 2015-02-24 LAB — BASIC METABOLIC PANEL
BUN: 7 mg/dL (ref 6–23)
CALCIUM: 9 mg/dL (ref 8.4–10.5)
CO2: 29 meq/L (ref 19–32)
CREATININE: 0.87 mg/dL (ref 0.40–1.50)
Chloride: 103 mEq/L (ref 96–112)
GFR: 128.14 mL/min (ref >60.00)
GLUCOSE: 104 mg/dL — AB (ref 70–99)
Potassium: 3.8 mEq/L (ref 3.5–5.1)
Sodium: 139 mEq/L (ref 135–145)

## 2015-02-24 LAB — HEMOGLOBIN A1C: Hgb A1c MFr Bld: 6.3 % (ref 4.6–6.5)

## 2015-02-24 MED ORDER — AZELASTINE HCL 0.05 % OP SOLN: 1.0000 [drp] | Freq: Two times a day (BID) | OPHTHALMIC | Status: AC

## 2015-02-24 MED ORDER — METFORMIN HCL 500 MG PO TABS: 500.0000 mg | ORAL_TABLET | Freq: Two times a day (BID) | ORAL | Status: AC

## 2015-02-24 NOTE — Progress Notes (Signed)
Pre visit review using our clinic review tool, if applicable. No additional management support is needed unless otherwise documented below in the visit note. 
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
## 2015-02-24 NOTE — Assessment & Plan Note (Signed)
Type 2 diabetes maintained on metformin. Denies adverse side effects or hypoglycemic events. Control appears improved with blood sugars in the 80s. Eye exam is up to date. Foot exam completed today. Pneumvax status addressed and declined at this time. Continue current dosage of metformin pending A1c results.

## 2015-02-24 NOTE — Patient Instructions (Signed)
Thank you for choosing [facility].  Summary/Instructions:  Your prescription(s) have been submitted to your pharmacy or been printed and provided for you. Please take as directed and contact our office if you believe you are having problem(s) with the medication(s) or have any questions.  Please stop by the lab on the basement level of the building for your blood work. Your results will be released to MyChart (or called to you) after review, usually within 72 hours after test completion. If any changes need to be made, you will be notified at that same time.  If your symptoms worsen or fail to improve, please contact our office for further instruction, or in case of emergency go directly to the emergency room at the closest medical facility.   If the eye drops do not help - follow up with opthalmology.

## 2015-02-24 NOTE — Telephone Encounter (Signed)
Please inform patient that his A1c indicates that he has improved from 7.3 to 6.3 indicating excellent control of his diabetes. He can reduce the amount of days he checks his blood sugars to 2-3 times per week. Also his kidney function is within the normal limits. Therefore please continue to take his medications as prescribed and we will follow up in 4 months.

## 2015-02-24 NOTE — Assessment & Plan Note (Signed)
Exam consistent with allergic conjunctivitis. Start azelastine. Follow up with opthalmology if symptoms worsen or fail to improve.

## 2015-02-25 NOTE — Telephone Encounter (Signed)
LVM for pt to call back.

## 2015-03-02 NOTE — Telephone Encounter (Signed)
LVM detailed per pts request of results below.

## 2015-03-08 ENCOUNTER — Inpatient Hospital Stay (HOSPITAL_COMMUNITY): Payer: 59

## 2015-03-08 ENCOUNTER — Encounter (HOSPITAL_COMMUNITY): Payer: Self-pay

## 2015-03-08 ENCOUNTER — Inpatient Hospital Stay (HOSPITAL_COMMUNITY)
Admission: EM | Admit: 2015-03-08 | Discharge: 2015-03-08 | DRG: 445 | Disposition: A | Discharge status: Home / Self Care | Payer: 59 | Attending: Family Medicine | Admitting: Family Medicine

## 2015-03-08 ENCOUNTER — Emergency Department (HOSPITAL_COMMUNITY): Payer: 59

## 2015-03-08 DIAGNOSIS — E1165 Type 2 diabetes mellitus with hyperglycemia: Secondary | ICD-10-CM | POA: Diagnosis present

## 2015-03-08 DIAGNOSIS — Z9981 Dependence on supplemental oxygen: Secondary | ICD-10-CM | POA: Diagnosis not present

## 2015-03-08 DIAGNOSIS — Z7982 Long term (current) use of aspirin: Secondary | ICD-10-CM | POA: Diagnosis not present

## 2015-03-08 DIAGNOSIS — Z87891 Personal history of nicotine dependence: Secondary | ICD-10-CM | POA: Diagnosis not present

## 2015-03-08 DIAGNOSIS — R079 Chest pain, unspecified: Secondary | ICD-10-CM | POA: Diagnosis present

## 2015-03-08 DIAGNOSIS — I110 Hypertensive heart disease with heart failure: Secondary | ICD-10-CM | POA: Diagnosis present

## 2015-03-08 DIAGNOSIS — R109 Unspecified abdominal pain: Secondary | ICD-10-CM | POA: Diagnosis not present

## 2015-03-08 DIAGNOSIS — I509 Heart failure, unspecified: Secondary | ICD-10-CM | POA: Diagnosis present

## 2015-03-08 DIAGNOSIS — Z6841 Body Mass Index (BMI) 40.0 and over, adult: Secondary | ICD-10-CM

## 2015-03-08 DIAGNOSIS — K828 Other specified diseases of gallbladder: Secondary | ICD-10-CM | POA: Diagnosis present

## 2015-03-08 DIAGNOSIS — K8020 Calculus of gallbladder without cholecystitis without obstruction: Principal | ICD-10-CM | POA: Diagnosis present

## 2015-03-08 DIAGNOSIS — Z7984 Long term (current) use of oral hypoglycemic drugs: Secondary | ICD-10-CM | POA: Diagnosis not present

## 2015-03-08 DIAGNOSIS — Z79899 Other long term (current) drug therapy: Secondary | ICD-10-CM

## 2015-03-08 DIAGNOSIS — R0602 Shortness of breath: Secondary | ICD-10-CM | POA: Diagnosis present

## 2015-03-08 DIAGNOSIS — R1011 Right upper quadrant pain: Secondary | ICD-10-CM

## 2015-03-08 DIAGNOSIS — E662 Morbid (severe) obesity with alveolar hypoventilation: Secondary | ICD-10-CM | POA: Diagnosis present

## 2015-03-08 DIAGNOSIS — D869 Sarcoidosis, unspecified: Secondary | ICD-10-CM | POA: Diagnosis present

## 2015-03-08 DIAGNOSIS — G4733 Obstructive sleep apnea (adult) (pediatric): Secondary | ICD-10-CM | POA: Diagnosis present

## 2015-03-08 LAB — CBC
HEMATOCRIT: 48.2 % (ref 39.0–52.0)
HEMOGLOBIN: 15.7 g/dL (ref 13.0–17.0)
MCH: 27 pg (ref 26.0–34.0)
MCHC: 32.6 g/dL (ref 30.0–36.0)
MCV: 83 fL (ref 78.0–100.0)
Platelets: 264 10*3/uL (ref 150–400)
RBC: 5.81 MIL/uL (ref 4.22–5.81)
RDW: 16 % — ABNORMAL HIGH (ref 11.5–15.5)
WBC: 8.7 10*3/uL (ref 4.0–10.5)

## 2015-03-08 LAB — BASIC METABOLIC PANEL
ANION GAP: 8 (ref 5–15)
BUN: 7 mg/dL (ref 6–20)
CALCIUM: 9.2 mg/dL (ref 8.9–10.3)
CO2: 29 mmol/L (ref 22–32)
Chloride: 101 mmol/L (ref 101–111)
Creatinine, Ser: 0.87 mg/dL (ref 0.61–1.24)
Glucose, Bld: 114 mg/dL — ABNORMAL HIGH (ref 65–99)
POTASSIUM: 3.7 mmol/L (ref 3.5–5.1)
Sodium: 138 mmol/L (ref 135–145)

## 2015-03-08 LAB — I-STAT TROPONIN, ED
TROPONIN I, POC: 0 ng/mL (ref 0.00–0.08)
Troponin i, poc: 0 ng/mL (ref 0.00–0.08)

## 2015-03-08 LAB — COMPREHENSIVE METABOLIC PANEL
ALK PHOS: 94 U/L (ref 38–126)
ALT: 26 U/L (ref 17–63)
AST: 55 U/L — ABNORMAL HIGH (ref 15–41)
Albumin: 3.5 g/dL (ref 3.5–5.0)
Anion gap: 9 (ref 5–15)
BILIRUBIN TOTAL: 0.8 mg/dL (ref 0.3–1.2)
BUN: 7 mg/dL (ref 6–20)
CALCIUM: 9.2 mg/dL (ref 8.9–10.3)
CO2: 29 mmol/L (ref 22–32)
CREATININE: 0.84 mg/dL (ref 0.61–1.24)
Chloride: 101 mmol/L (ref 101–111)
GFR calc non Af Amer: >60 mL/min (ref >60)
GLUCOSE: 112 mg/dL — AB (ref 65–99)
Potassium: 3.8 mmol/L (ref 3.5–5.1)
SODIUM: 139 mmol/L (ref 135–145)
TOTAL PROTEIN: 7.2 g/dL (ref 6.5–8.1)

## 2015-03-08 LAB — BRAIN NATRIURETIC PEPTIDE: B Natriuretic Peptide: 27.8 pg/mL (ref 0.0–100.0)

## 2015-03-08 LAB — LIPASE, BLOOD: LIPASE: 25 U/L (ref 22–51)

## 2015-03-08 MED ORDER — FUROSEMIDE 10 MG/ML IJ SOLN
40.0000 mg | Freq: Once | INTRAMUSCULAR | Status: AC
  Administered 2015-03-08: 40 mg via INTRAVENOUS
  Filled 2015-03-08: qty 4

## 2015-03-08 MED ORDER — NITROGLYCERIN 0.4 MG SL SUBL
0.4000 mg | SUBLINGUAL_TABLET | SUBLINGUAL | Status: DC | PRN
  Administered 2015-03-08: 0.4 mg via SUBLINGUAL
  Filled 2015-03-08: qty 1

## 2015-03-08 MED ORDER — MORPHINE SULFATE (PF) 4 MG/ML IV SOLN
4.0000 mg | Freq: Once | INTRAVENOUS | Status: AC
  Administered 2015-03-08: 4 mg via INTRAVENOUS
  Filled 2015-03-08: qty 1

## 2015-03-08 MED ORDER — ONDANSETRON HCL 4 MG/2ML IJ SOLN
4.0000 mg | Freq: Once | INTRAMUSCULAR | Status: AC
  Administered 2015-03-08: 4 mg via INTRAVENOUS

## 2015-03-08 MED ORDER — GI COCKTAIL ~~LOC~~
30.0000 mL | Freq: Once | ORAL | Status: AC
  Administered 2015-03-08: 30 mL via ORAL
  Filled 2015-03-08: qty 30

## 2015-03-08 NOTE — ED Notes (Signed)
Phlebotomy at the bedside  

## 2015-03-08 NOTE — ED Notes (Signed)
Admitting at bedside 

## 2015-03-08 NOTE — Consult Note (Signed)
Family Medicine Teaching Service Consult Note Service Pager: [phone]  Patient name: Stephen Conley Medical record number: [id number] Date of birth: 1980-08-20 Age: 34 y.o. Gender: male  Primary Care Provider: Mauricio Po, [place] Consultants: None Code Status: Full  Chief Complaint:  Abdominal pain, SOB  History of Present Illness:  Stephen Conley is a 34 y.o. Male, PMH is significant for morbid obesity, sarcoidosis, DM, HTN, presenting with with abdominal pain that started this evening at around 11 pm. It was initially located over his epigastric region then migrated over his right side. He denies nausea, vomiting diarrhea, fevers associated. He does report feeling very hot and getting very sweaty which resolved after taking his shirt and under shirt off in the ED. He had additionally had some shortness of breath when his pain started, but it resolved prior to arriving to the ED. He denies pain like this before.  In the ED he continues to have abdominal pain, worse at the RUQ that radiates to his back, but denies SOB, chest pain, diaphoresis, nausea or emesis. He reports the pain is helped by leaning forward.   Initially we were consulted by the ED for ACS rule out admission, but after discussing the patient's symptoms with the ED provider, the decision was made to consider abdominal pathology as his primary complaint, especially as patient denied chest pain or ever having it this evening. They were in agreement to obtain imaging and labs. US showed non obstructing cholelithiasis. After these resulted the ED felt comfortable discharging home with close follow up to work up intermittently symptomatic gall stones   Assessment and Plan: Stephen Conley is a 34 y.o. male presenting with abdominal pain and shortness of breath . PMH is significant for morbid obesity, sarcoidosis, DM, HTN  Given initial epigastric pain with SOB associated with risk factors, HTN, DM, morbid obesity must rule  out ACS,. However,  lack of chest pain and age speak against ACS, Heart Score 1. Initial EKG is normal , troponin normal. Last Echo 10/29/2014, limited styudy but normal LV function, mild LVH. Pain likely secondary to intra abdominal process. As pain largely located over RUQ, there is concern for gall bladder pathology. Mild elevated AST 55, else normal bili, normal ALT. Given radiation to his back and  pain relief with leaning forward pancreatitis is also on the differential. Normal Lipase 25 and WBC 8.7 making pacreatitis less likely. Also on the differential,  aortic dissection which is unlikely given hemodynamic stability.  Significantly [place] gall bladder shows cholelithiasis with mild gall bladder wall thickening and while non obstructing cannot rule our intermittent obstruction. Pain completely resolved after morphine x1. Shortness of breath resolved, with good oxygenation on room air. Given [place] finding, presentation likely secondary to symptomatic gall stones, however, should rule out ACS more definitively.  - Observe in ED - Repeat troponin 3 hours after previous and if negative and abdominal pain remains absent, consider discharge with close follow up with [place], his PCP for consideration of outpatient work up of cholelithiasis - morphine IV q2hr PRN - GI cocktail PRN  Disposition:  Home with PCP follow up   Review Of Systems: Per HPI   Patient Active Problem List   Diagnosis Date Noted  . Eye redness 02/24/2015  . Obesity hypoventilation syndrome ([place]) 10/31/2014  . Obstructive sleep apnea 10/31/2014  . Type 2 diabetes mellitus, uncontrolled ([place])   . Morbid obesity ([place])   . Essential hypertension   . 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 Hypoxia 10/29/2014  .  Congestive heart disease ([place])   . SOB (shortness of breath)   . Sarcoid ([place]) 06/19/2012  . Weight gain 06/19/2012   Past Medical History: Past Medical History  Diagnosis Date  . Sarcoidosis, lung ([place])   .  Collagen vascular disease ([place])   . Congestive heart disease ([place])   . New onset type 2 diabetes mellitus ([place])   . Essential hypertension    Past Surgical History: Past Surgical History  Procedure Laterality Date  . Tonsillectomy     Social History: Social History  Substance Use Topics  . Smoking status: Former Smoker -- 0.80 packs/day for 10 years    Types: Cigarettes    Quit date: 10/09/2013  . Smokeless tobacco: Never Used  . Alcohol Use: No   Additional social history: smokes marijuana daily, 2-3 16 oz beers per week, 2-3 cigs per day, deneis other substance use Please also refer to relevant sections of EMR.  Family History: Family History  Problem Relation Age of Onset  . Hypertension Mother   . Hypertension Father   . Diabetes Father   . Hypertension Sister   . Hypertension Brother   . Diabetes Paternal Aunt   . Diabetes Paternal Grandmother   . Colon cancer Paternal Grandfather    Allergies and Medications: No Known Allergies No current facility-administered medications on file prior to encounter.   Current Outpatient Prescriptions on File Prior to Encounter  Medication Sig Dispense Refill  . aspirin EC 81 MG EC tablet Take 1 tablet (81 mg total) by mouth daily. 30 tablet 0  . azelastine (OPTIVAR) 0.05 % ophthalmic solution Place 1 drop into both eyes 2 (two) times daily. 6 mL 0  . blood glucose meter kit and supplies KIT Dispense based on patient and insurance preference. Use up to four times daily as directed. (FOR ICD-9 250.00, 250.01). 1 each 0  . lisinopril (PRINIVIL,ZESTRIL) 5 MG tablet Take 1 tablet (5 mg total) by mouth daily. 30 tablet 3  . metFORMIN (GLUCOPHAGE) 500 MG tablet Take 1 tablet (500 mg total) by mouth 2 (two) times daily with a meal. 60 tablet 3    Objective: BP 169/109 mmHg  Pulse 72  Temp(Src) 97.8 F (36.6 C) (Oral)  Resp 15  Ht 5' 6"  (1.676 m)  Wt 450 lb 1.6 oz (204.164 kg)  BMI 72.68 kg/m2  SpO2 99% Exam: General: Mild  distress, initially bent over gurney as this helped his abd pain HEENT: PERRL, MMM, NCAT Cardiovascular: RRR, no murmurs auscultated Respiratory: CTAB, exam somewhat limited by body habitus Abdomen: obese, soft, tender most at right upper quadrant, murphy's test limited by body habitus, + BS Extremities- 2+ DP, chronic skin thickening, no LE edema Skin: acanthosis nigricans over posterior neck, else no rashes noted Neuro: No focal deficits Psych: normal mood and affect  Labs and Imaging: CBC BMET   Recent Labs Lab 03/08/15 0040  WBC 8.7  HGB 15.7  HCT 48.2  PLT 264    Recent Labs Lab 03/08/15 0040  NA 138  K 3.7  CL 101  CO2 29  BUN 7  CREATININE 0.87  GLUCOSE 114*  CALCIUM 9.2      CXR  IMPRESSION: Vascular congestion and borderline cardiomegaly, with small bilateral pleural effusions and increased interstitial markings, compatible with pulmonary edema.  Veatrice Bourbon, MD 03/08/2015, 2:45 AM PGY-2, [place] Intern pager: [phone], text pages welcome

## 2015-03-08 NOTE — Discharge Instructions (Signed)
Follow up with your doctor for further evaluation. Refer to attached documents for more information. Return to the ED with worsening or concerning symptoms.  °

## 2015-03-08 NOTE — ED Provider Notes (Signed)
CSN: [id number]     Arrival date & time 03/08/15  0017 History   First MD Initiated Contact with Patient 03/08/15 0029     Chief Complaint  Patient presents with  . Chest Pain     (Consider location/radiation/quality/duration/timing/severity/associated sxs/prior Treatment) HPI Comments: Patient is a 34 year old male with a past medical history of sarcoidosis, diabetes, hypertension, and CHF who presents with chest pain that started 1 hour prior to arrival. Symptoms started gradually and progressively worsened since the onset. Patient reports the pain is located over his anterior chest and does not radiate. He reports the pain feels like "gas" and "pressure" and is severe. No aggravating/alleviating factors. He reports associated SOB. He reports wearing 4L oxygen nasal cannula at home.     Past Medical History  Diagnosis Date  . Sarcoidosis, lung ([place])   . Collagen vascular disease ([place])   . Congestive heart disease ([place])   . New onset type 2 diabetes mellitus ([place])   . Essential hypertension    Past Surgical History  Procedure Laterality Date  . Tonsillectomy     Family History  Problem Relation Age of Onset  . Hypertension Mother   . Hypertension Father   . Diabetes Father   . Hypertension Sister   . Hypertension Brother   . Diabetes Paternal Aunt   . Diabetes Paternal Grandmother   . Colon cancer Paternal Grandfather    Social History  Substance Use Topics  . Smoking status: Former Smoker -- 0.80 packs/day for 10 years    Types: Cigarettes    Quit date: 10/09/2013  . Smokeless tobacco: Never Used  . Alcohol Use: No    Review of Systems  Respiratory: Positive for shortness of breath.   Cardiovascular: Positive for chest pain.  All other systems reviewed and are negative.     Allergies  Review of patient's allergies indicates no known allergies.  Home Medications   Prior to Admission medications   Medication Sig Start Date End Date Taking? Authorizing  Provider  aspirin EC 81 MG EC tablet Take 1 tablet (81 mg total) by mouth daily. 10/31/14   Ashly Windell Moulding, DO  azelastine (OPTIVAR) 0.05 % ophthalmic solution Place 1 drop into both eyes 2 (two) times daily. 02/24/15   [place]en Circle, FNP  blood glucose meter kit and supplies KIT Dispense based on patient and insurance preference. Use up to four times daily as directed. (FOR ICD-9 250.00, 250.01). 10/31/14   Aquilla Hacker, MD  lisinopril (PRINIVIL,ZESTRIL) 5 MG tablet Take 1 tablet (5 mg total) by mouth daily. 11/07/14   Golden Circle, FNP  metFORMIN (GLUCOPHAGE) 500 MG tablet Take 1 tablet (500 mg total) by mouth 2 (two) times daily with a meal. 02/24/15   Golden Circle, FNP   BP 169/109 mmHg  Pulse 87  Temp(Src) 97.8 F (36.6 C) (Oral)  Resp 22  Ht 5' 6"  (1.676 m)  Wt 450 lb 1.6 oz (204.164 kg)  BMI 72.68 kg/m2  SpO2 97% Physical Exam  Constitutional: He is oriented to person, place, and time. He appears well-developed and well-nourished. No distress.  Diaphoretic. Morbidly obese.   HENT:  Head: Normocephalic and atraumatic.  Eyes: Conjunctivae and EOM are normal.  Neck: Normal range of motion.  Cardiovascular: Normal rate and regular rhythm.  Exam reveals no gallop and no friction rub.   No murmur heard. 2+ bilateral lower extremity pitting edema.   Pulmonary/Chest: Breath sounds normal. He has no wheezes. He has  no rales. He exhibits no tenderness.  Abdominal: Soft. He exhibits no distension. There is no tenderness. There is no rebound.  Musculoskeletal: Normal range of motion.  Neurological: He is alert and oriented to person, place, and time. Coordination normal.  Speech is goal-oriented. Moves limbs without ataxia.   Skin: Skin is warm. He is diaphoretic.  Psychiatric: He has a normal mood and affect. His behavior is normal.  Nursing note and vitals reviewed.   ED Course  Procedures (including critical care time) Labs Review Labs Reviewed  BASIC METABOLIC  PANEL - Abnormal; Notable for the following:    Glucose, Bld 114 (*)    All other components within normal limits  CBC - Abnormal; Notable for the following:    RDW 16.0 (*)    All other components within normal limits  BRAIN NATRIURETIC PEPTIDE  I-STAT TROPOININ, ED    Imaging Review Dg Chest 2 View  03/08/2015   CLINICAL DATA:  Acute onset of generalized chest pain and shortness of breath. Initial encounter.  EXAM: CHEST  2 VIEW  COMPARISON:  Chest radiograph and CTA of the chest performed 10/29/2014  FINDINGS: The lungs are well-aerated. Vascular congestion is noted, with small bilateral pleural effusions and increased interstitial markings, compatible with pulmonary edema. There is no evidence of pneumothorax.  The heart is borderline enlarged. No acute osseous abnormalities are seen.  IMPRESSION: Vascular congestion and borderline cardiomegaly, with small bilateral pleural effusions and increased interstitial markings, compatible with pulmonary edema.   Electronically Signed   By: Garald Balding M.D.   On: 03/08/2015 01:49   I have personally reviewed and evaluated these images and lab results as part of my medical decision-making.  ED ECG REPORT   Date: 03/08/2015  Rate: 83  Rhythm: normal sinus rhythm  QRS Axis: normal  Intervals: normal  ST/T Wave abnormalities: normal  Conduction Disutrbances:none  Narrative Interpretation: NSR without acute changes  Old EKG Reviewed: unchanged  I have personally reviewed the EKG tracing and agree with the computerized printout as noted.    EKG Interpretation None      MDM   Final diagnoses:  Chest pain, unspecified chest pain type  SOB (shortness of breath)  RUQ abdominal pain  Gallbladder sludge    12:41 AM Labs and chest xray unremarkable for acute changes. Vitals stable and patient afebrile. He wears 4L oxygen at home at baseline. Patient will have 40 mg IV lasix.   2:05 AM Patient's chest xray shows vascular congestion  and small bilateral pleural effusions. Patient will be admitted for diuresis and to cycle troponin.   The resident saw the patient and he denied having chest pain and now reports RUQ pain. Patient will have RUQ [place] and be discharged if no cholecystitis.   His US shows no cholecystitis and he will be discharged with instructions to follow up with PCP.   Alvina Chou, PA-C 03/08/15 0219  Alvina Chou, PA-C 03/12/15 3790  Serita Grit, MD 03/12/15 [phone]

## 2015-03-08 NOTE — ED Notes (Addendum)
Pt states he was sitting at home and started having generalized CP and SOB. States he is on oxygen at home also at 4 liters but the canister was empty. Denies any nausea or vomiting. Pt has become diaphoretic while in triage

## 2015-03-08 NOTE — ED Notes (Signed)
Spoke with admitting, wants to get ultrasound, c/o RUQ pain, may not need admission.

## 2015-04-20 ENCOUNTER — Ambulatory Visit: Payer: 59 | Admitting: Cardiology

## 2015-04-20 DIAGNOSIS — R0989 Other specified symptoms and signs involving the circulatory and respiratory systems: Secondary | ICD-10-CM

## 2015-04-24 ENCOUNTER — Encounter: Payer: Self-pay | Admitting: Cardiology

## 2015-06-29 ENCOUNTER — Ambulatory Visit: Payer: 59 | Admitting: Family

## 2015-07-02 ENCOUNTER — Ambulatory Visit: Payer: 59 | Admitting: Family

## 2015-07-17 ENCOUNTER — Other Ambulatory Visit: Payer: Self-pay | Admitting: Family

## 2015-10-08 ENCOUNTER — Ambulatory Visit: Payer: Self-pay | Admitting: Family

## 2015-10-22 ENCOUNTER — Ambulatory Visit: Payer: Self-pay | Admitting: Family

## 2016-02-24 ENCOUNTER — Other Ambulatory Visit: Payer: Self-pay | Admitting: Family

## 2017-02-25 IMAGING — CR DG CHEST 1V PORT
1 series · 1 of 1 positions shown · non-contrast
Comparison: 06/19/2012.

CLINICAL DATA: Chest pain and shortness of breath.

EXAM:
PORTABLE CHEST - 1 VIEW

[AP]
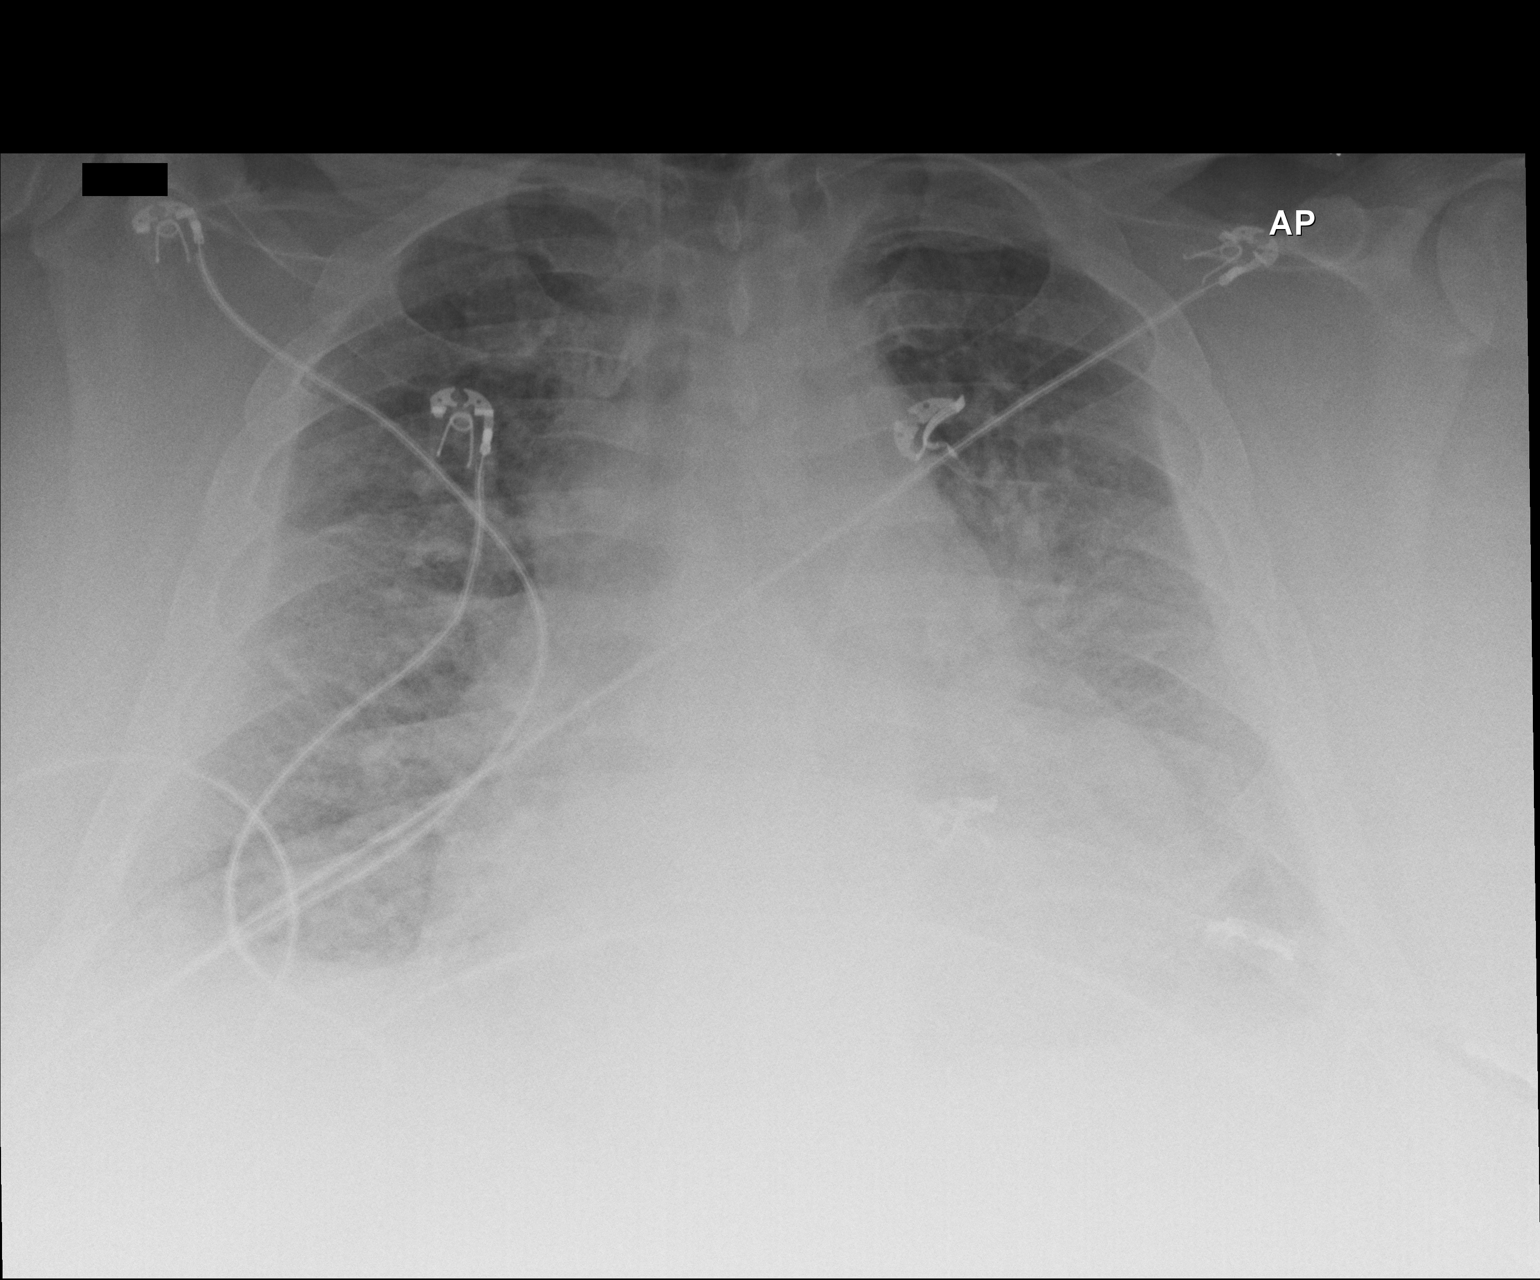

[1 of 1 positions shown; findings below may reference images not displayed]

FINDINGS: Trachea is midline. Heart is enlarged. Diffuse mixed interstitial
and airspace opacification. Small bilateral effusions. Pleural
thickening in the lateral hemithoraces is unchanged and likely due
to extrapleural fat.
IMPRESSION: Congestive heart failure. Likely underlying changes of sarcoid in
this patient with a known history.

## 2017-02-25 IMAGING — CT CT ANGIO CHEST
2 of 9 series · 18 of 46 positions shown · IV contrast (omnipaque)
Comparison: Chest x-ray today.

CLINICAL DATA: Hypoxia and history of sarcoidosis.

EXAM:
CT ANGIOGRAPHY CHEST WITH CONTRAST
TECHNIQUE: Multidetector CT imaging of the chest was performed using the
standard protocol during bolus administration of intravenous
contrast. Multiplanar CT image reconstructions and MIPs were
obtained to evaluate the vascular anatomy.
CONTRAST:  100mL OMNIPAQUE IOHEXOL 350 MG/ML SOLN

[Series 6: thins · axial · 0.82mm/px · z∈[+1203,+1468]mm · 15 of 293 slices shown]
[im 14/293  lung]
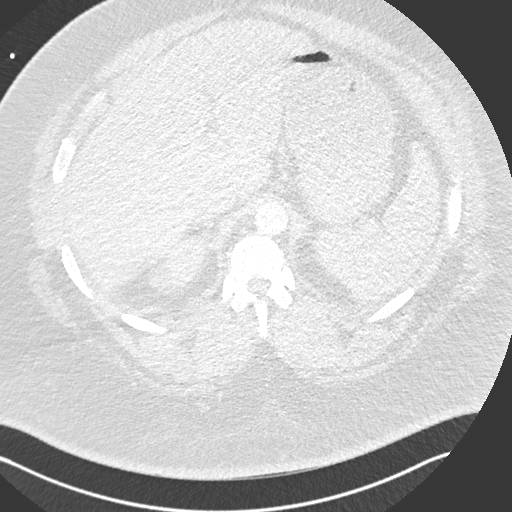
[im 42/293  soft-tissue]
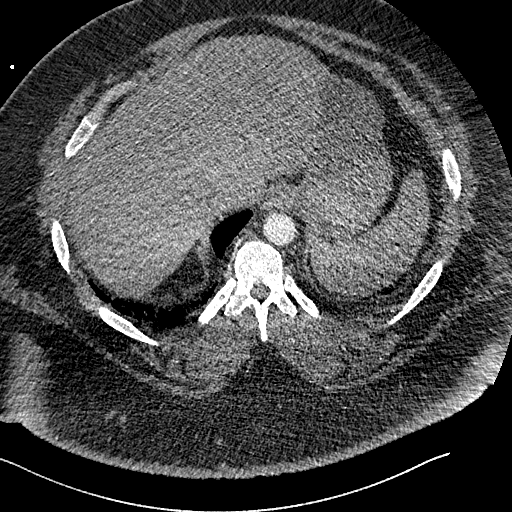
[im 56/293  lung]
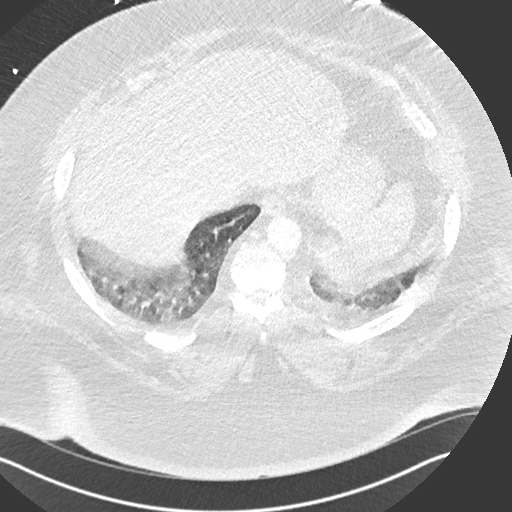
[im 70/293  soft-tissue]
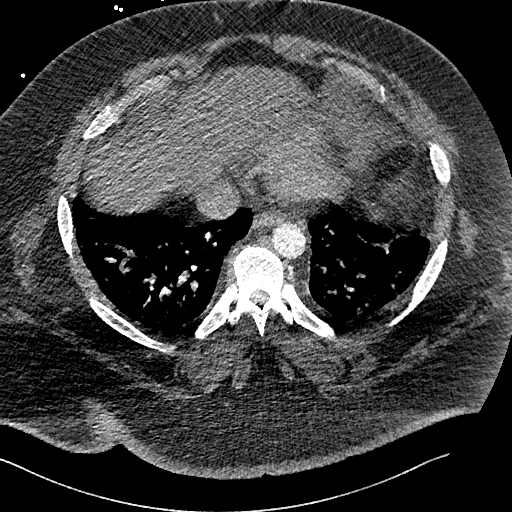
[im 98/293  lung]
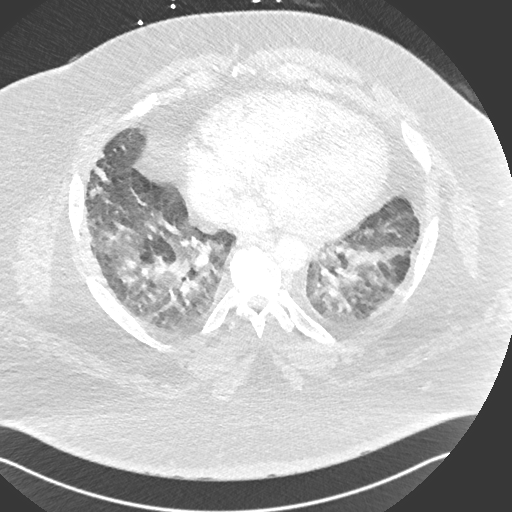
[im 112/293  soft-tissue]
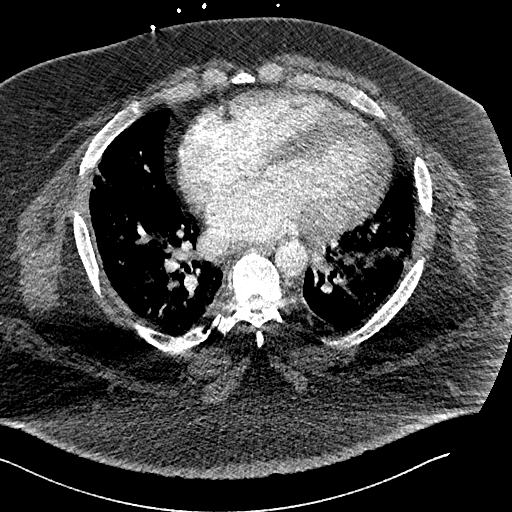
[im 126/293  lung]
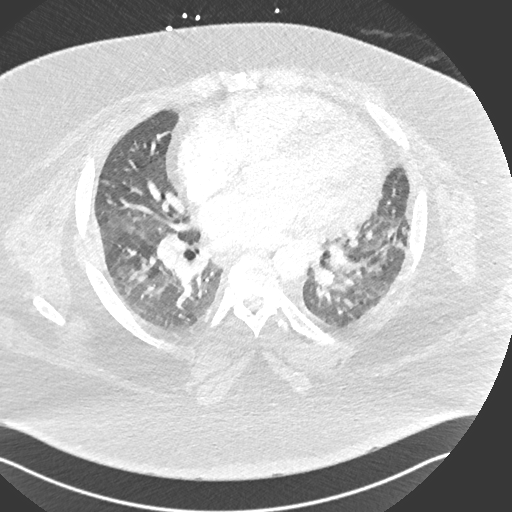
[im 153/293  soft-tissue]
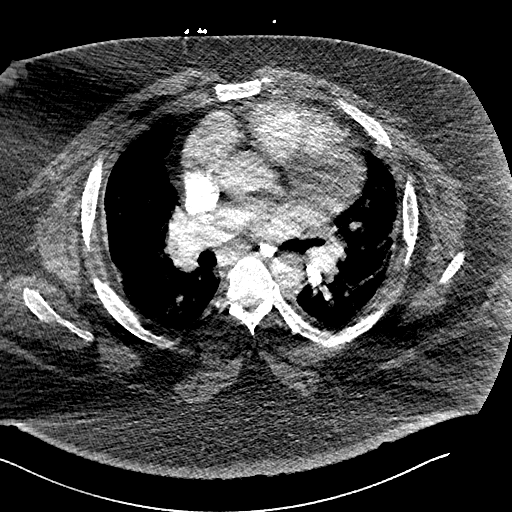
[im 167/293  lung]
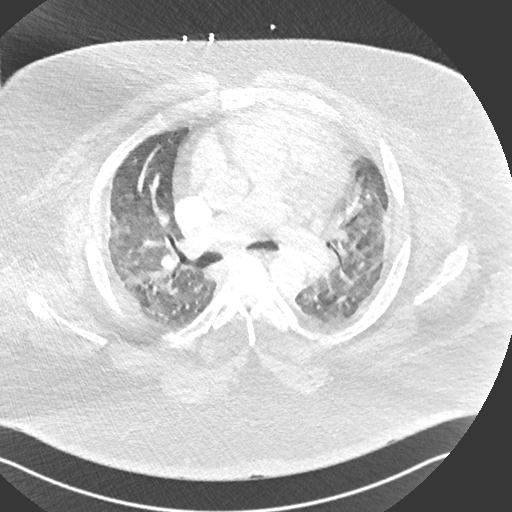
[im 181/293  soft-tissue]
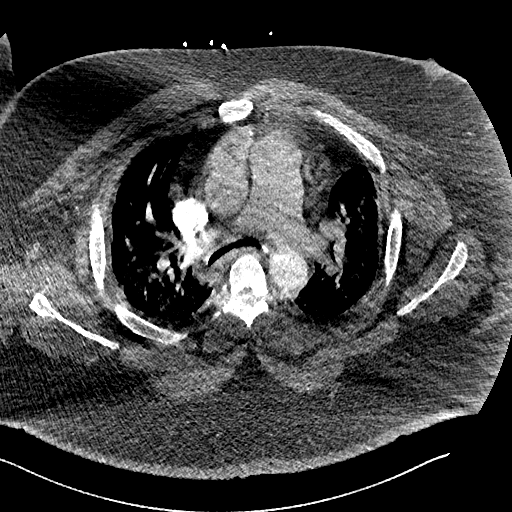
[im 209/293  lung]
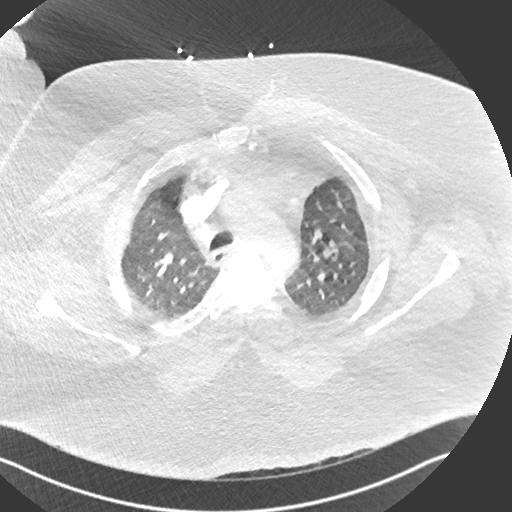
[im 223/293  soft-tissue]
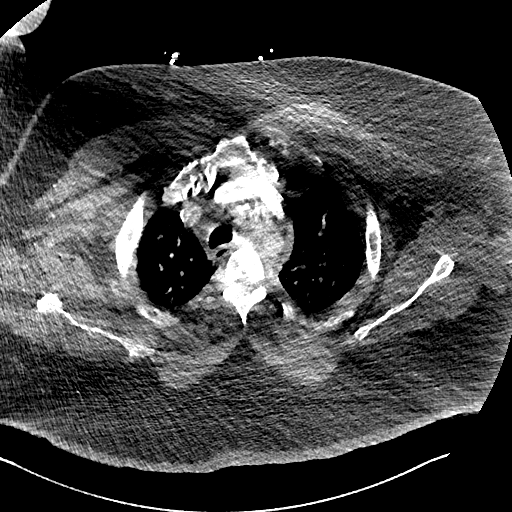
[im 237/293  lung]
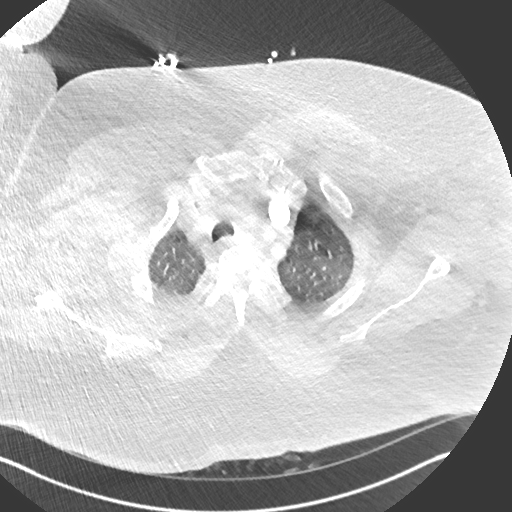
[im 265/293  soft-tissue]
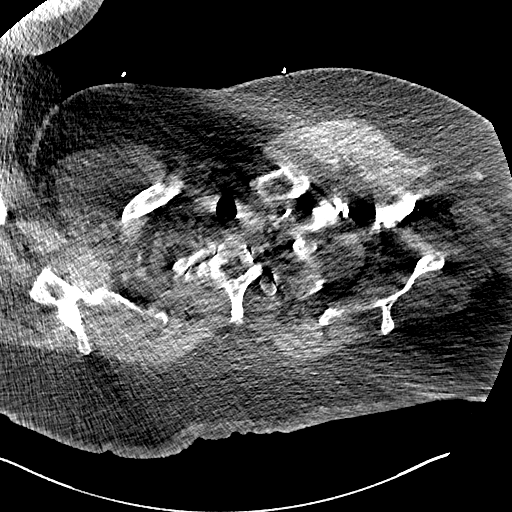
[im 279/293  lung]
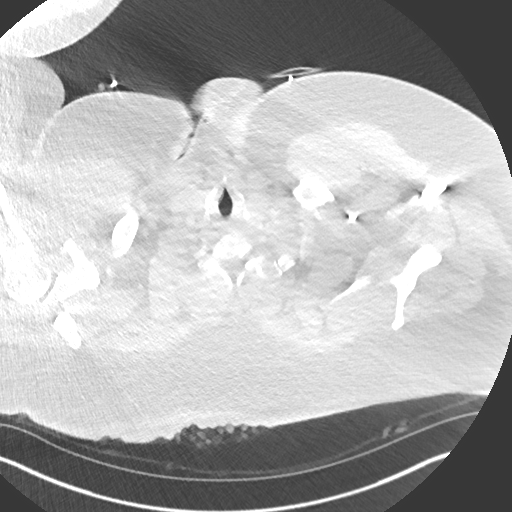

[Series 8: coronal mpr · coronal · 0.58mm/px · 3 of 139 slices shown]
[im 35/139  soft-tissue]
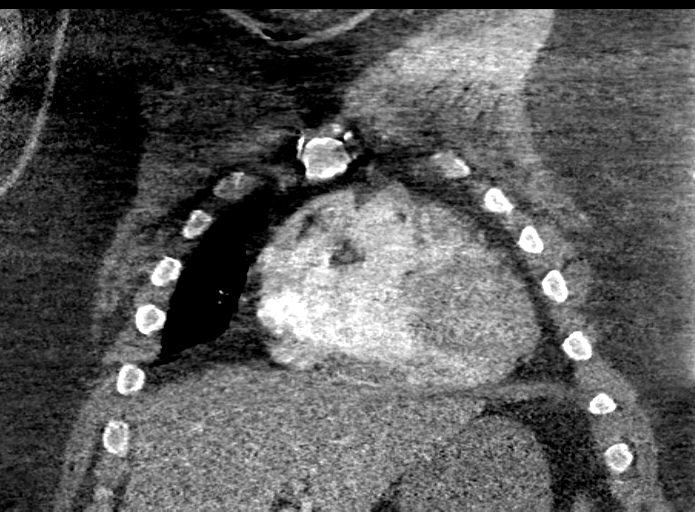
[im 70/139  soft-tissue]
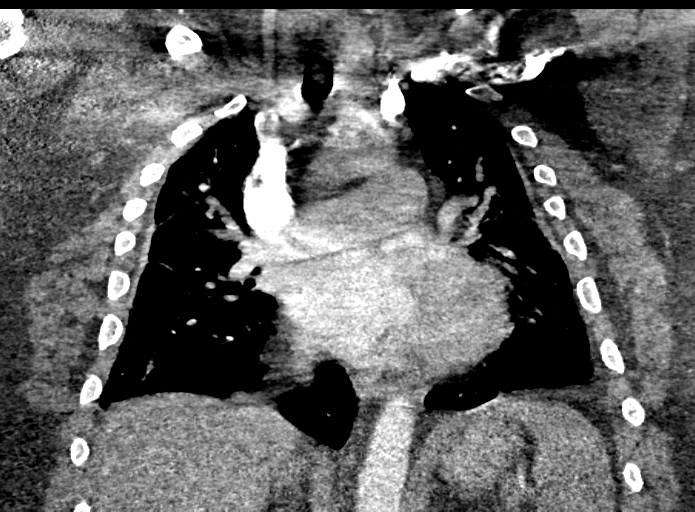
[im 104/139  soft-tissue]
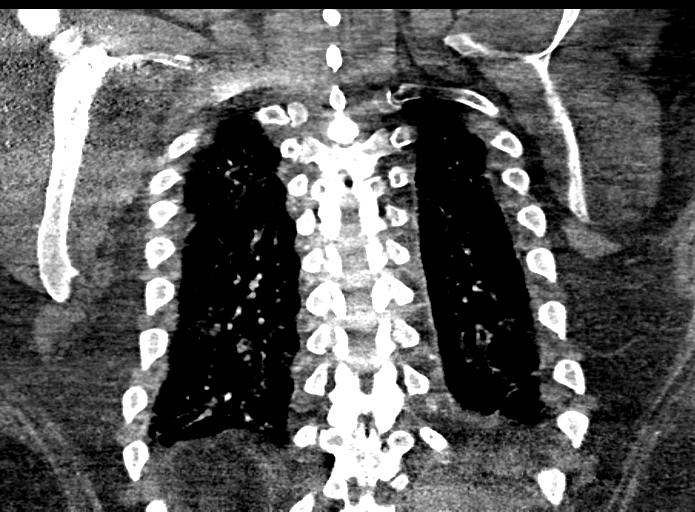

[18 of 46 positions shown; findings below may reference images not displayed]

FINDINGS: Lungs are adequately inflated and demonstrate mild hazy bilateral
perihilar opacification likely mild interstitial edema. No evidence
of pleural effusion. Subtle linear atelectasis over the right middle
lobe. Airway is within normal.

Prominent overlying soft tissues causing mild image degradation.
Mild to moderate cardiomegaly. Although timing of contrast bolus
appears adequate, pulmonary arterial opacification is not optimal as
there are no significant emboli detected. 1 cm prevascular lymph
node as well as 1.4 cm right subcarinal lymph node which may be due
to patient's known sarcoid. No significant hilar or axillary
adenopathy.

Images through the upper abdomen are within normal. There is mild
degenerate change of the spine.

Review of the MIP images confirms the above findings.
IMPRESSION: No evidence of pulmonary emboli.

Cardiomegaly with bilateral hazy perihilar opacification likely mild
interstitial edema.

Minimal mediastinal adenopathy likely related to patient's known
sarcoid.

## 2017-07-05 IMAGING — DX DG CHEST 2V
2 series · 2 of 2 positions shown · non-contrast
Comparison: Chest radiograph and CTA of the chest performed
10/29/2014

CLINICAL DATA: Acute onset of generalized chest pain and shortness
of breath. Initial encounter.

EXAM:
CHEST  2 VIEW

[chest pa]
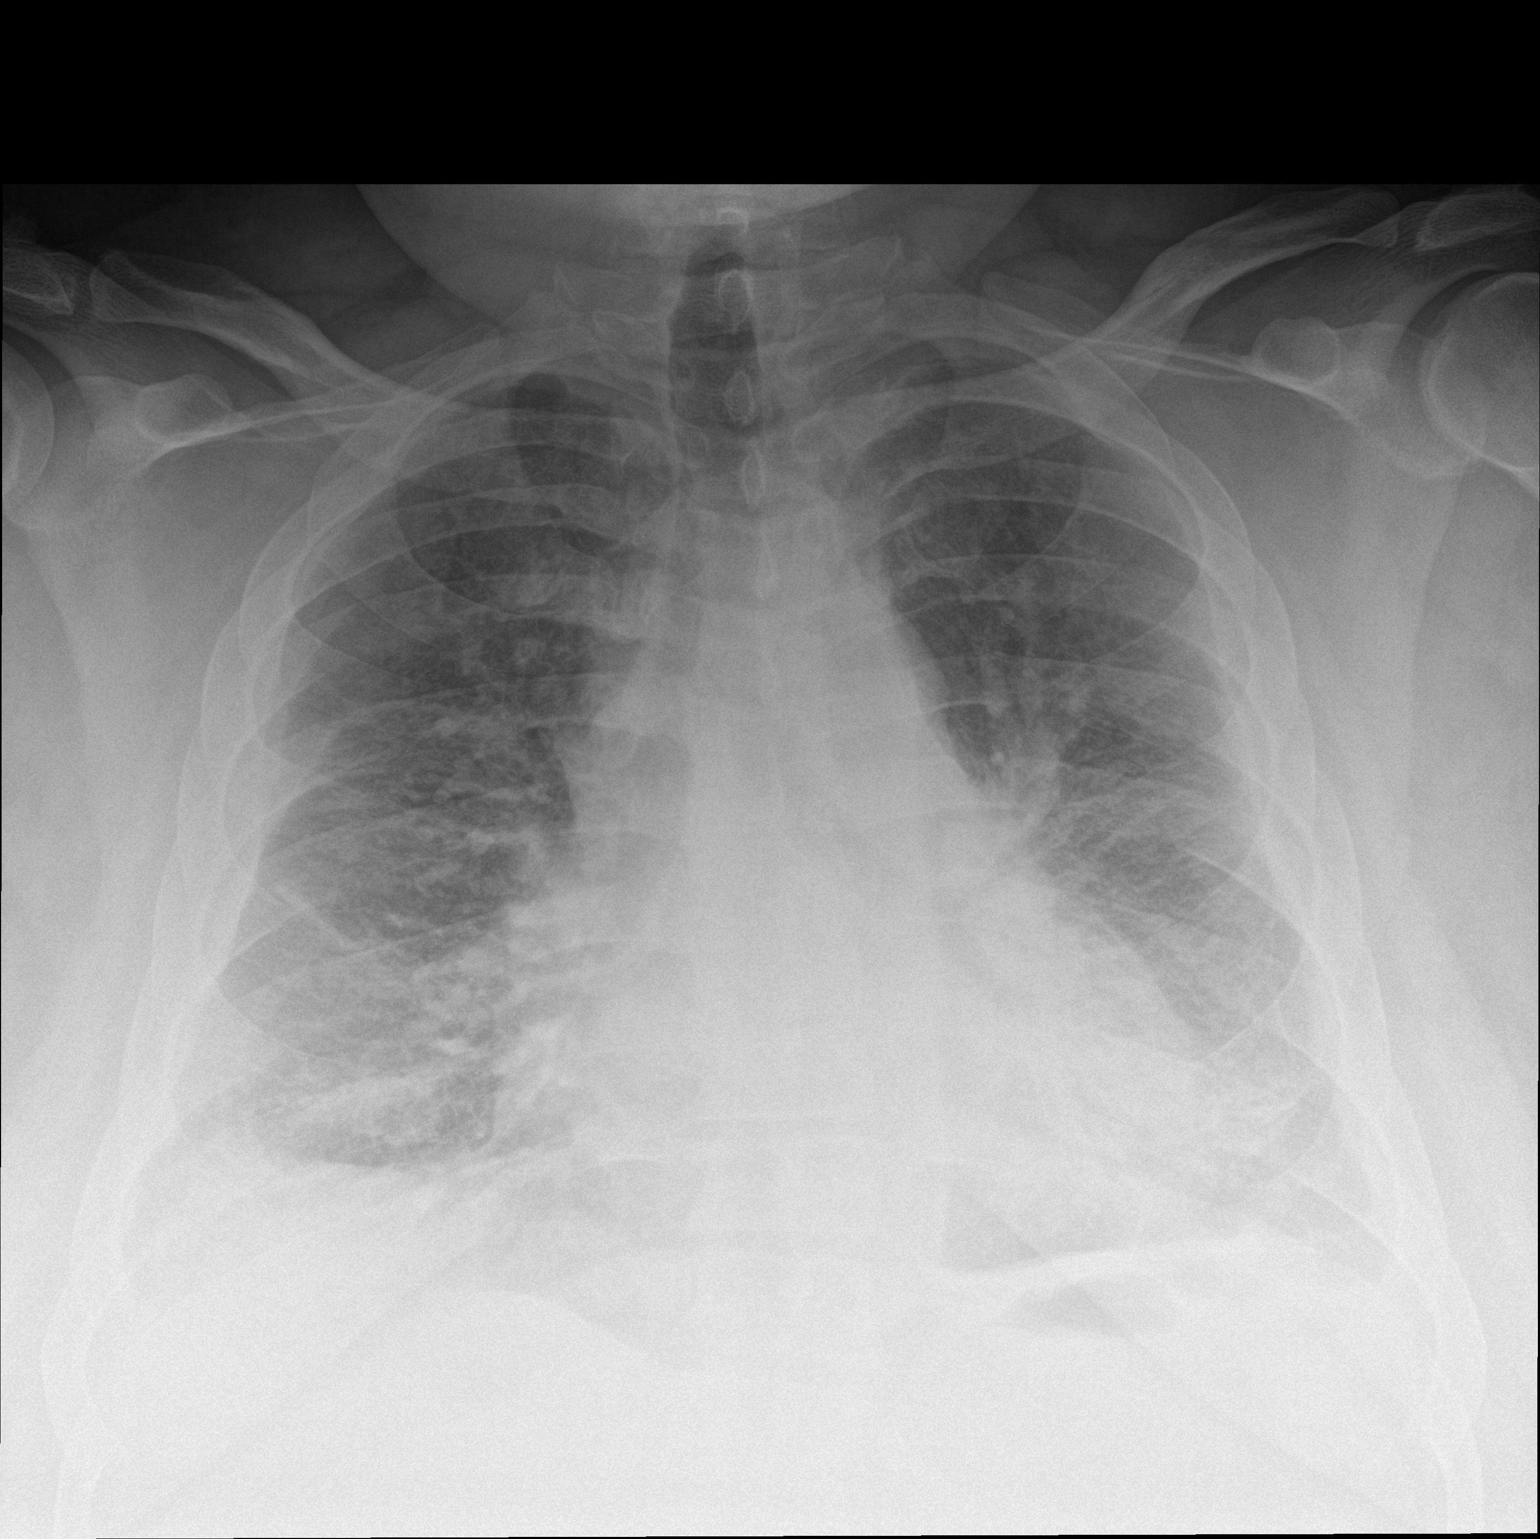

[chest lat]
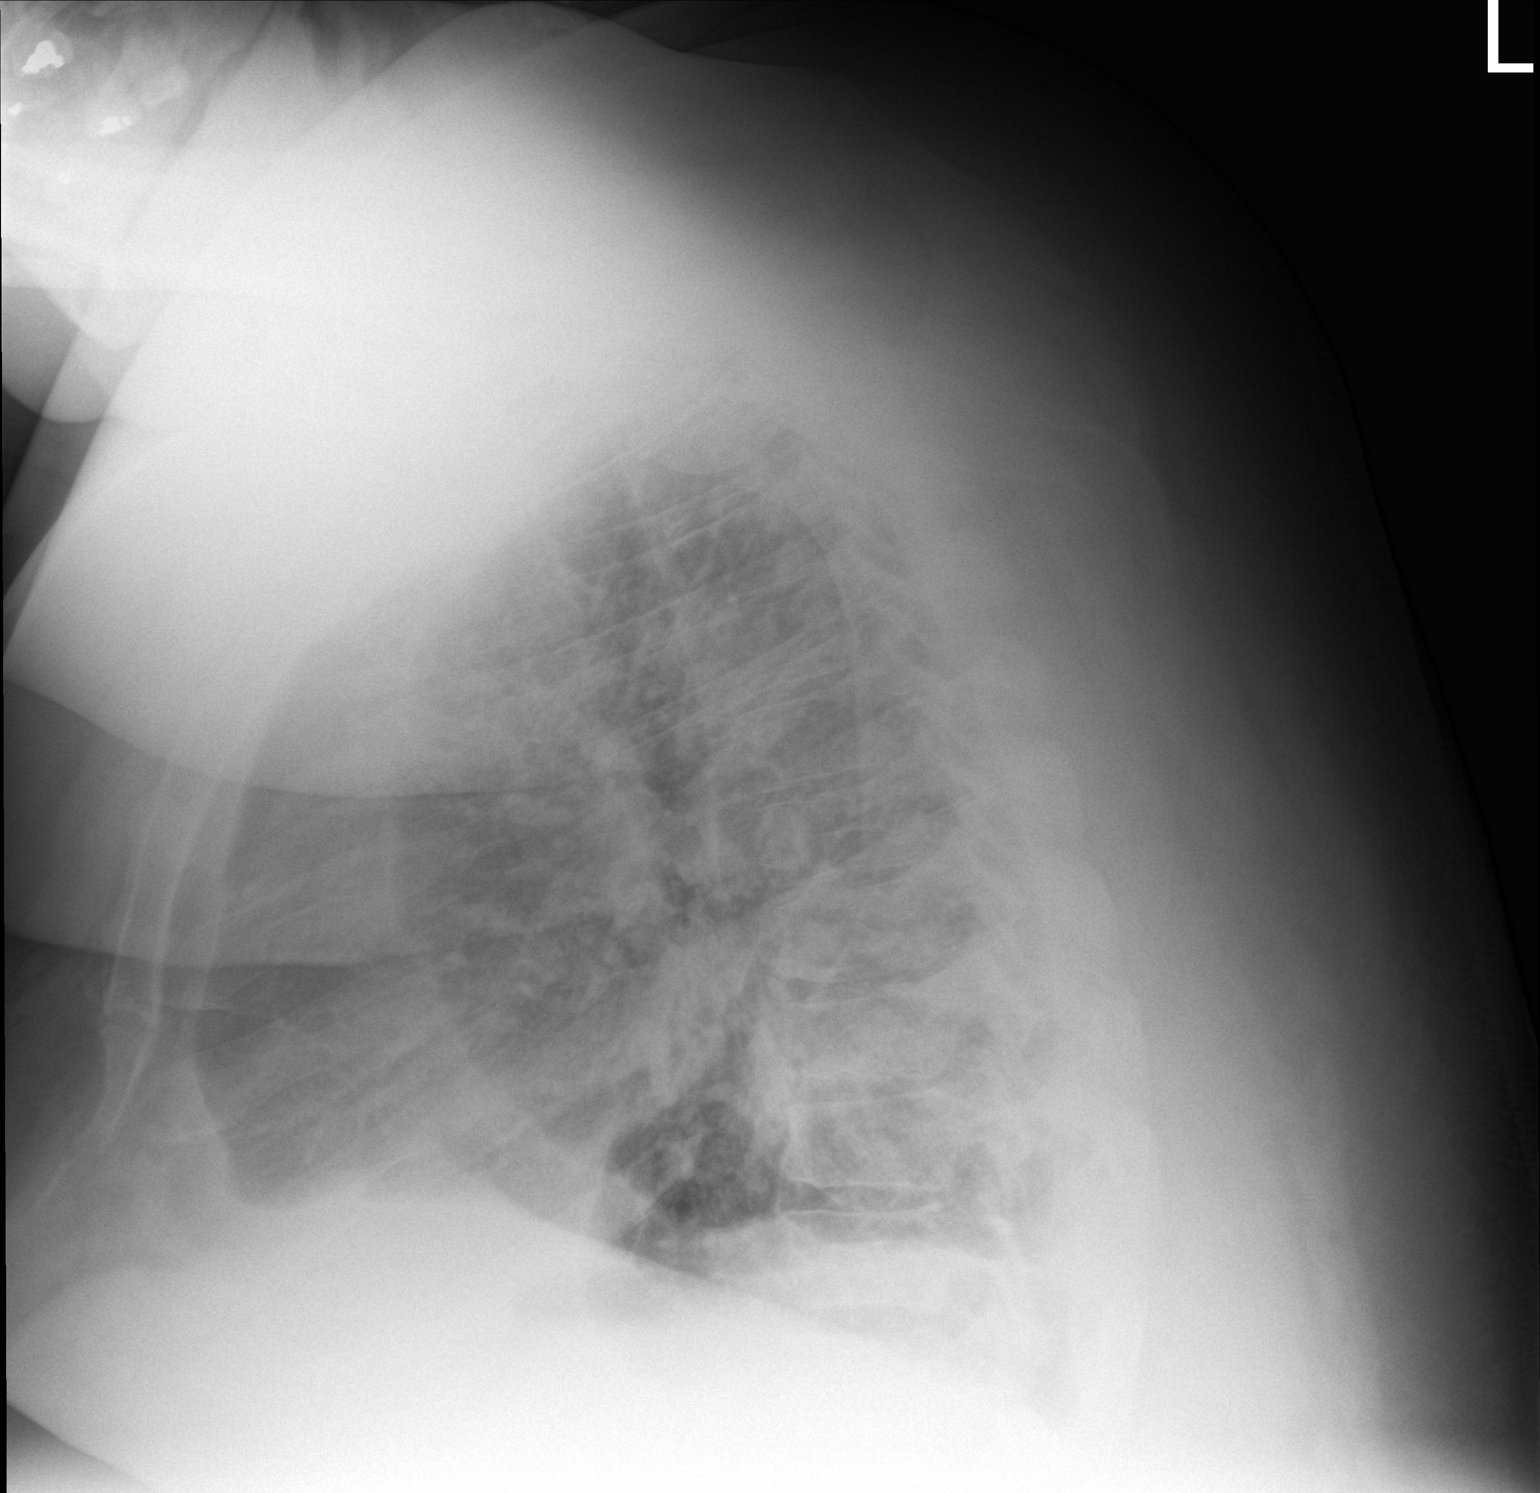

[2 of 2 positions shown; findings below may reference images not displayed]

FINDINGS: The lungs are well-aerated. Vascular congestion is noted, with small
bilateral pleural effusions and increased interstitial markings,
compatible with pulmonary edema. There is no evidence of
pneumothorax.

The heart is borderline enlarged. No acute osseous abnormalities are
seen.
IMPRESSION: Vascular congestion and borderline cardiomegaly, with small
bilateral pleural effusions and increased interstitial markings,
compatible with pulmonary edema.

## 2018-07-29 ENCOUNTER — Observation Stay: Admit: 2018-07-29 | Payer: PRIVATE HEALTH INSURANCE | Primary: Internal Medicine

## 2018-07-29 ENCOUNTER — Emergency Department: Admit: 2018-07-29 | Payer: PRIVATE HEALTH INSURANCE | Primary: Internal Medicine

## 2018-07-29 ENCOUNTER — Inpatient Hospital Stay
Admission: EM | Admit: 2018-07-29 | Discharge: 2018-08-01 | Disposition: A | Discharge status: Home / Self Care | Payer: PRIVATE HEALTH INSURANCE | Source: Other Acute Inpatient Hospital | Admitting: Internal Medicine

## 2018-07-29 DIAGNOSIS — I639 Cerebral infarction, unspecified: Principal | ICD-10-CM

## 2018-07-29 LAB — COMPREHENSIVE METABOLIC PANEL W/ REFLEX TO MG FOR LOW K
ALT: 22 U/L (ref 10–40)
AST: 20 U/L (ref 15–37)
Albumin/Globulin Ratio: 1.8 (ref 1.1–2.2)
Albumin: 4.3 g/dL (ref 3.4–5.0)
Alkaline Phosphatase: 54 U/L (ref 40–129)
Anion Gap: 11 (ref 3–16)
BUN: 22 mg/dL — ABNORMAL HIGH (ref 7–20)
CO2: 27 mmol/L (ref 21–32)
Calcium: 9.5 mg/dL (ref 8.3–10.6)
Chloride: 101 mmol/L (ref 99–110)
Creatinine: 0.8 mg/dL — ABNORMAL LOW (ref 0.9–1.3)
GFR African American: >60 (ref >60)
GFR Non-African American: >60 (ref >60)
Globulin: 2.4 g/dL
Glucose: 96 mg/dL (ref 70–99)
Potassium reflex Magnesium: 4.3 mmol/L (ref 3.5–5.1)
Sodium: 139 mmol/L (ref 136–145)
Total Bilirubin: 0.5 mg/dL (ref 0.0–1.0)
Total Protein: 6.7 g/dL (ref 6.4–8.2)

## 2018-07-29 LAB — CBC WITH AUTO DIFFERENTIAL
Basophils %: 0.6 %
Basophils Absolute: 0 10*3/uL (ref 0.0–0.2)
Eosinophils %: 4 %
Eosinophils Absolute: 0.2 10*3/uL (ref 0.0–0.6)
Hematocrit: 41.5 % (ref 40.5–52.5)
Hemoglobin: 14.3 g/dL (ref 13.5–17.5)
Lymphocytes %: 24.2 %
Lymphocytes Absolute: 1.2 10*3/uL (ref 1.0–5.1)
MCH: 30.1 pg (ref 26.0–34.0)
MCHC: 34.3 g/dL (ref 31.0–36.0)
MCV: 87.5 fL (ref 80.0–100.0)
MPV: 6.9 fL (ref 5.0–10.5)
Monocytes %: 9.9 %
Monocytes Absolute: 0.5 10*3/uL (ref 0.0–1.3)
Neutrophils %: 61.3 %
Neutrophils Absolute: 3.1 10*3/uL (ref 1.7–7.7)
Platelets: 337 10*3/uL (ref 135–450)
RBC: 4.75 M/uL (ref 4.20–5.90)
RDW: 13.4 % (ref 12.4–15.4)
WBC: 5.1 10*3/uL (ref 4.0–11.0)

## 2018-07-29 LAB — TROPONIN: Troponin: <0.01 ng/mL (ref <0.01)

## 2018-07-29 MED ORDER — IOPAMIDOL 76 % IV SOLN
76 % | Freq: Once | INTRAVENOUS | Status: AC | PRN
Start: 2018-07-29 — End: 2018-07-29
  Administered 2018-07-29: 16:00:00 75 mL via INTRAVENOUS

## 2018-07-29 MED ORDER — ACETAMINOPHEN 325 MG PO TABS
325 MG | ORAL | Status: DC | PRN
Start: 2018-07-29 — End: 2018-08-01
  Administered 2018-07-29 – 2018-08-01 (×6): 650 mg via ORAL

## 2018-07-29 MED ORDER — POLYETHYLENE GLYCOL 3350 17 G PO PACK
17 g | Freq: Every day | ORAL | Status: DC | PRN
Start: 2018-07-29 — End: 2018-08-01

## 2018-07-29 MED ORDER — ENOXAPARIN SODIUM 40 MG/0.4ML SC SOLN
400.4 MG/0.4ML | Freq: Every day | SUBCUTANEOUS | Status: DC
Start: 2018-07-29 — End: 2018-08-01

## 2018-07-29 MED ORDER — ONDANSETRON HCL 4 MG/2ML IJ SOLN
4 MG/2ML | Freq: Four times a day (QID) | INTRAMUSCULAR | Status: DC | PRN
Start: 2018-07-29 — End: 2018-08-01

## 2018-07-29 MED ORDER — NORMAL SALINE FLUSH 0.9 % IV SOLN
0.9 % | INTRAVENOUS | Status: DC | PRN
Start: 2018-07-29 — End: 2018-08-01

## 2018-07-29 MED ORDER — NORMAL SALINE FLUSH 0.9 % IV SOLN
0.9 % | Freq: Two times a day (BID) | INTRAVENOUS | Status: DC
Start: 2018-07-29 — End: 2018-08-01
  Administered 2018-07-30 – 2018-08-01 (×5): 10 mL via INTRAVENOUS

## 2018-07-29 MED ORDER — ATORVASTATIN CALCIUM 80 MG PO TABS
80 MG | Freq: Every evening | ORAL | Status: DC
Start: 2018-07-29 — End: 2018-08-01
  Administered 2018-07-30 – 2018-08-01 (×3): 80 mg via ORAL

## 2018-07-29 MED ORDER — ASPIRIN 300 MG RE SUPP
300 MG | Freq: Every day | RECTAL | Status: DC
Start: 2018-07-29 — End: 2018-08-01

## 2018-07-29 MED ORDER — ASPIRIN 81 MG PO TBEC
81 MG | Freq: Every day | ORAL | Status: DC
Start: 2018-07-29 — End: 2018-08-01
  Administered 2018-07-29 – 2018-08-01 (×4): 81 mg via ORAL

## 2018-07-29 MED ORDER — PROMETHAZINE HCL 25 MG PO TABS
25 MG | Freq: Four times a day (QID) | ORAL | Status: DC | PRN
Start: 2018-07-29 — End: 2018-08-01

## 2018-07-29 MED FILL — ASPIRIN LOW DOSE 81 MG PO TBEC: 81 MG | ORAL | Qty: 1

## 2018-07-29 MED FILL — ACETAMINOPHEN 325 MG PO TABS: 325 mg | ORAL | Qty: 2

## 2018-07-29 NOTE — H&P (Addendum)
Hospital Medicine History & Physical      PCP: No primary care provider on file.    Date of Admission: 07/29/2018    Date of Service: Pt seen/examined, with 1st encounter, on 07/29/2018 and Admitted to Inpatient   Chief Complaint:  Slurred speech      History Of Present Illness: The patient is a 38 y.o. male who presents to [facility] with an acute onset of a rt side posterior headache followed about 10 minutes later by slurred speech and a left side facial droop. His wife called 911. He had no other focal weakness or paresthesias. In the ed labs were normal, vitals stable. Ct head was nonacute. He has never had anything like this before. He has no h/o migraines. His father had a cabg in his 59s. Grandmother and ischemic cva in her 41s.   When I saw him his speech had improved bu he had a decreased smile on the left.     Past Medical History:    History reviewed. No pertinent past medical history.    Past Surgical History:    History reviewed. No pertinent surgical history.    Medications Prior to Admission:    Prior to Admission medications    Not on File       Allergies:  Patient has no known allergies.    Social History:      TOBACCO:   reports that he has been smoking. He has been smoking about 0.25 packs per day. He has never used smokeless tobacco.  ETOH:   reports current alcohol use.      Family History:      History reviewed. No pertinent family history.    REVIEW OF SYSTEMS:   Positive for slurred speech and as noted in the HPI. All other systems reviewed and negative.    PHYSICAL EXAM:    BP 128/67    Pulse 70    Temp 98.4 ??F (36.9 ??C) (Oral)    Resp 20    SpO2 99%     General appearance: No apparent distress, cooperative.  HEENT Normal cephalic, atraumatic without obvious deformity.  PERRL. EOM.  Conjunctivae/corneas clear.  Neck: Supple, No jugular venous  distention/bruits.  Trachea midline   Lungs: Clear to auscultation, bilaterally without Rales/Wheezes/Rhonchi without accessory muscle use.  Heart: Regular rate and rhythm with Normal S1/S2 without murmurs, rubs or gallops  Abdomen: Soft, non-tender or non-distended without rigidity or guarding and positive bowel sounds all four quadrants.  Extremities: No clubbing, cyanosis, or edema bilaterally.    Skin: Skin color, texture, turgor normal.  No rashes or lesions.  Neurologic: Alert and oriented X 3, neurovascularly intact with sensory/motor intact upper extremities/lower extremities, bilaterally. Slight decreased smile on the left. Upper face is symmetrical.   Mental status: Alert, oriented, thought content appropriate.  Peripheral Pulses: +3 Easily felt, not easily obliterated with pressure  Cap refill  +2 sec    CXR:  I have reviewed the CXR with the following interpretation: negative    CBC   Recent Labs     07/29/18  1030   WBC 5.1   HGB 14.3   HCT 41.5   PLT 337      RENAL  Recent Labs     07/29/18  1029   NA 139   K 4.3   CL 101   CO2 27   BUN 22*   CREATININE 0.8*     LFT'S  Recent Labs     07/29/18  1029   AST 20   ALT 22   BILITOT 0.5   ALKPHOS 54     COAG  No results for input(s): INR in the last 72 hours.  CARDIAC ENZYMES  Recent Labs     07/29/18  1029   TROPONINI <0.01       U/A:  No results found for: NITRITE, COLORU, WBCUA, RBCUA, MUCUS, BACTERIA, CLARITYU, SPECGRAV, LEUKOCYTESUR, BLOODU, GLUCOSEU, AMORPHOUS    ABG  No results found for: HCO3ART, BEART, O2SATART, PHART, THGBART, PCO2ART, PO2ART, TCO2ART        There are no active hospital problems to display for this patient.        PHYSICIANS CERTIFICATION:    I certify that YGNACIO RADEK is expected to be hospitalized for more than 2 midnights based on the following assessment and plan:      ASSESSMENT/PLAN:    Possible TIA  - with symptoms: slurred speech, left facial droop; etiology could be a complex migraine  - admit to OBS to RO  TIA/CVA  - symptoms improved so no tpa given  - head CT neg for acute pathology  - CTA head/neck negative  - MRI  - if mri is positive will order an ECHO  - ASA, statin  - consult neurology   - check lipids, HBA1c  - allow permissive HTN, SBP < 220, DBP < 110    Headache  - toradol prn    DVT Prophylaxis: lovenox  Diet: No diet orders on file  Code Status: No Order    Dispo - obs       Nava Song R Memorie Yokoyama, DO    Thank you No primary care provider on file. for the opportunity to be involved in this patient's care. If you have any questions or concerns please feel free to contact me at [phone].

## 2018-07-29 NOTE — Progress Notes (Signed)
4 Eyes Skin Assessment     The patient is being assess for  Admission    I agree that 2 RN's have performed a thorough Head to Toe Skin Assessment on the patient. ALL assessment sites listed below have been assessed.       Areas assessed by both nurses:   [x]    Head, Face, and Ears   [x]    Shoulders, Back, and Chest  [x]    Arms, Elbows, and Hands   [x]    Coccyx, Sacrum, and IschIum  [x]    Legs, Feet, and Heels        Does the Patient have Skin Breakdown?  No         [place] Prevention initiated:  No   Wound Care Orders initiated:  No      WOC nurse consulted for Pressure Injury (Stage 3,4, Unstageable, DTI, NWPT, and Complex wounds), New and Established Ostomies:  No      Nurse 1 eSignature: Electronically signed by Eilene Ghazi, RN on 07/29/18 at 5:05 PM    **SHARE this note so that the co-signing nurse is able to place an eSignature**    Nurse 2 eSignature: Electronically signed by Dorita Fray, RN on 07/29/18 at 9:33 PM

## 2018-07-29 NOTE — ED Notes (Signed)
Report given to [facility] Ruari Mudgett, RN  07/29/18 [phone]

## 2018-07-29 NOTE — Plan of Care (Signed)
Problem: HEMODYNAMIC STATUS  Goal: Patient has stable vital signs and fluid balance  Outcome: Ongoing     Problem: ACTIVITY INTOLERANCE/IMPAIRED MOBILITY  Goal: Mobility/activity is maintained at optimum level for patient  Outcome: Ongoing     Problem: COMMUNICATION IMPAIRMENT  Goal: Ability to express needs and understand communication  Outcome: Ongoing     Problem: SAFETY  Goal: Free from accidental physical injury  Outcome: Ongoing  Goal: Free from intentional harm  Outcome: Ongoing     Problem: DAILY CARE  Goal: Daily care needs are met  Outcome: Ongoing     Problem: PAIN  Goal: Patient's pain/discomfort is manageable  Outcome: Ongoing     Problem: SKIN INTEGRITY  Goal: Skin integrity is maintained or improved  Outcome: Ongoing     Problem: KNOWLEDGE DEFICIT  Goal: Patient/S.O. demonstrates understanding of disease process, treatment plan, medications, and discharge instructions.  Outcome: Ongoing     Problem: DISCHARGE BARRIERS  Goal: Patient's continuum of care needs are met  Outcome: Ongoing

## 2018-07-29 NOTE — Progress Notes (Signed)
Neurology consult has been called   @17 :48 07/29/2018  RN is aware.  Peter Rhodes

## 2018-07-29 NOTE — Plan of Care (Signed)
Problem: HEMODYNAMIC STATUS  Goal: Patient has stable vital signs and fluid balance  07/29/2018 2318 by Antony Odea, RN  Outcome: Ongoing     Problem: ACTIVITY INTOLERANCE/IMPAIRED MOBILITY  Goal: Mobility/activity is maintained at optimum level for patient  07/29/2018 2318 by Antony Odea, RN  Outcome: Ongoing     Problem: COMMUNICATION IMPAIRMENT  Goal: Ability to express needs and understand communication  07/29/2018 2318 by Antony Odea, RN  Outcome: Ongoing     Problem: SAFETY  Goal: Free from accidental physical injury  07/29/2018 2318 by Antony Odea, RN  Outcome: Ongoing     Problem: SAFETY  Goal: Free from intentional harm  07/29/2018 2318 by Antony Odea, RN  Outcome: Ongoing     Problem: DAILY CARE  Goal: Daily care needs are met  07/29/2018 2318 by Antony Odea, RN  Outcome: Ongoing     Problem: PAIN  Goal: Patient's pain/discomfort is manageable  07/29/2018 2318 by Antony Odea, RN  Outcome: Ongoing  Note:   Pt provided with PRN pain meds for headache.  Pt also provided with emotional support       Problem: SKIN INTEGRITY  Goal: Skin integrity is maintained or improved  07/29/2018 2318 by Antony Odea, RN  Outcome: Ongoing     Problem: KNOWLEDGE DEFICIT  Goal: Patient/S.O. demonstrates understanding of disease process, treatment plan, medications, and discharge instructions.  07/29/2018 2318 by Antony Odea, RN  Outcome: Ongoing     Problem: DISCHARGE BARRIERS  Goal: Patient's continuum of care needs are met  07/29/2018 2318 by Antony Odea, RN  Outcome: Ongoing     Problem: Pain:  Description  Pain management should include both nonpharmacologic and pharmacologic interventions.  Goal: Pain level will decrease  Description  Pain level will decrease  Outcome: Ongoing     Problem: Pain:  Description  Pain management should include both nonpharmacologic and pharmacologic interventions.  Goal: Control of acute pain  Description  Control of acute pain  Outcome:  Ongoing     Problem: Pain:  Description  Pain management should include both nonpharmacologic and pharmacologic interventions.  Goal: Control of chronic pain  Description  Control of chronic pain  Outcome: Ongoing

## 2018-07-29 NOTE — Progress Notes (Signed)
NAME:  Peter Rhodes  DATE OF BIRTH:  Mar 20, 1981  MEDICAL RECORD NUMBER:  [id number]  TODAYS DATE:  07/29/2018    Discussed personal risk factors for Stroke /TIA with patient/family, and ways to reduce the risk for a recurrent stroke. Patient's personal risk factors which were identified are:     []  Alcohol Abuse: check with your physician before any alcohol consumption.   []  Atrial fibrillation: may cause blood clots.  []  Drug Abuse: Seek help, talk with your doctor  []  Clotting Disorder  []  Diabetes  [x]  Family history of stroke or heart disease  []  High Blood Pressure/Hypertension: work with your physician.  []  High cholesterol: monitor cholesterol levels with your physician.   []  Overweight/Obesity: work with your physician for your ideal body weight.  []  Physical Inactivity: get regular exercise as directed by your physician.   []  Personal history of previous TIA or stroke  []  Poor Diet; decrease salt (sodium) in your diet, follow diet directed by physician.   [x]  Smoking: Cigarette/Cigar: stop smoking.         Advised pt. that you can reduce your risk for stroke/TIA by modifying/controlling the risk factors that you have. Pt.advised to take the medications as prescribed, which will be detailed in the discharge instructions, and to not stop taking them without consulting their physician. In addition, pt. advised to maintain a healthy diet, exercise regularly and to not smoke.      [facility]'s Stroke treatment and prevention, Managing your recovery  notebook  provided and/or reviewed  with patient/family.  The notebook includes, but not limited to, sections addressing warning signs & symptoms of a stroke, which are: sudden numbness or weakness especially on one side of the body, sudden confusion, difficulty speaking or understanding, sudden changes in vision, sudden dizziness or loss of balance/ coordination, or sudden severe headache.  The need to call EMS (911) immediately if signs & symptoms occur is  emphasized . The notebook also provides education on Stroke community resources and stroke advocacy.    The need for follow-up after discharge was highlighted with patient/family with them being able to repeat understanding of the importance of this.      Electronically signed by Eilene Ghazi, RN on 07/29/2018 at 5:05 PM

## 2018-07-29 NOTE — ED Provider Notes (Signed)
[facility] EMERGENCY DEPARTMENT  eMERGENCY dEPARTMENT eNCOUnter        Pt Name: Peter Rhodes  MRN: [id number]  Birthdate 1980/07/29  Date of evaluation: 07/29/2018  Provider: Synetta Fail, MD  PCP: No primary care provider on file.      CHIEF COMPLAINT       Chief Complaint   Patient presents with   . Shaking     Started this morning around 845, pt was cooking breakfast, has since slowed down a little bit       HISTORY OFPRESENT ILLNESS   (Location/Symptom, Timing/Onset, Context/Setting, Quality, Duration, Modifying Factors,Severity)  Note limiting factors.     Peter Rhodes is a 38 y.o. male       Location/Symptom: Dizziness lightheaded and shaking  Timing/Onset: 8:45 AM  Context/Setting: Fairly healthy 38 year old male.  He was at home cooking breakfast when he had sudden onset of dizziness and lightheadedness.  He does not describe vertigo.  He is complaining of generalized tremors.  He has a bit of a right-sided headache.  There was also some dysarthria that his wife appreciated.  Quality: He does have a right-sided headache.  Duration: He still having some tremors.  My Nurolon exam is normal.  He thinks his speech is still just a little bit off.  Modifying Factors: None    Nursing Noteswere all reviewed and agreed with or any disagreements were addressed  in the HPI.    REVIEW OF SYSTEMS    (2-9 systems for level 4, 10 or more for level 5)     Review of Systems   Constitutional: Negative for chills, fatigue and fever.   HENT: Negative for ear pain, rhinorrhea and sore throat.    Eyes: Negative for pain, discharge, redness and visual disturbance.   Respiratory: Negative for cough, shortness of breath and wheezing.    Cardiovascular: Negative for chest pain, palpitations and leg swelling.   Gastrointestinal: Negative for abdominal pain, diarrhea, nausea and vomiting.   Genitourinary: Negative for difficulty urinating and dysuria.   Musculoskeletal: Negative for  arthralgias, back pain and myalgias.   Skin: Negative for rash.   Allergic/Immunologic: Negative for environmental allergies.   Neurological: Positive for tremors, speech difficulty, light-headedness and headaches. Negative for dizziness, seizures and syncope.   Hematological: Negative for adenopathy.   Psychiatric/Behavioral: Negative for suicidal ideas. The patient is not nervous/anxious.          PAST MEDICAL HISTORY   History reviewed. No pertinent past medical history.      SURGICAL HISTORY   History reviewed. No pertinent surgical history.      CURRENTMEDICATIONS       Previous Medications    No medications on file       ALLERGIES     Patient has no known allergies.    FAMILY HISTORY     History reviewed. No pertinent family history.       SOCIAL HISTORY       Social History     Socioeconomic History   . Marital status: Married     Spouse name: None   . Number of children: None   . Years of education: None   . Highest education level: None   Occupational History   . None   Social Needs   . Financial resource strain: None   . Food insecurity:     Worry: None     Inability: None   . Transportation needs:  Medical: None     Non-medical: None   Tobacco Use   . Smoking status: Current Some Day Smoker     Packs/day: 0.25   . Smokeless tobacco: Never Used   Substance and Sexual Activity   . Alcohol use: Yes   . Drug use: Never   . Sexual activity: None   Lifestyle   . Physical activity:     Days per week: None     Minutes per session: None   . Stress: None   Relationships   . Social connections:     Talks on phone: None     Gets together: None     Attends religious service: None     Active member of club or organization: None     Attends meetings of clubs or organizations: None     Relationship status: None   . Intimate partner violence:     Fear of current or ex partner: None     Emotionally abused: None     Physically abused: None     Forced sexual activity: None   Other Topics Concern   . None   Social History  Narrative   . None       SCREENINGS   NIH Stroke Scale  Interval: Baseline  Level of Consciousness (1a. ): Alert  LOC Questions (1b. ): Answers both correctly  LOC Commands (1c. ): Performs both tasks correctly  Best Gaze (2. ): Normal  Visual (3. ): No visual loss  Facial Palsy (4. ): Normal symmetrical movement  Motor Arm, Left (5a. ): No drift  Motor Arm, Right (5b. ): No drift  Motor Leg, Left (6a. ): No drift  Motor Leg, Right (6b. ): No drift  Limb Ataxia (7. ): Absent  Sensory (8. ): Normal  Best Language (9. ): No aphasia(complains of "thick tongue")  Dysarthria (10. ): Normal  Extinction and Inattention (11): No abnormality  Total: 0Glasgow Coma Scale  Eye Opening: Spontaneous  Best Verbal Response: Oriented  Best Motor Response: Obeys commands  Glasgow Coma Scale Score: 15        PHYSICAL EXAM    (up to 7 for level 4, 8 or more for level 5)     ED Triage Vitals [07/29/18 0949]   BP Temp Temp Source Pulse Resp SpO2 Height Weight   106/82 98.4 F (36.9 C) Oral 70 20 99 % -- --      oral temperature is 98.4 F (36.9 C). His blood pressure is 122/82 and his pulse is 70. His respiration is 20 and oxygen saturation is 100%.     Physical Exam  Constitutional:       Appearance: He is well-developed. He is not diaphoretic.   HENT:      Head: Normocephalic and atraumatic.      Right Ear: External ear normal.      Left Ear: External ear normal.   Eyes:      General: No scleral icterus.        Right eye: No discharge.         Left eye: No discharge.   Neck:      Musculoskeletal: Normal range of motion.      Thyroid: No thyromegaly.      Vascular: No JVD.      Trachea: No tracheal deviation.   Cardiovascular:      Rate and Rhythm: Normal rate and regular rhythm.      Heart sounds: No murmur. No friction rub.  No gallop.    Pulmonary:      Effort: Pulmonary effort is normal. No respiratory distress.      Breath sounds: Normal breath sounds. No stridor. No wheezing or rales.   Abdominal:      General: There is no  distension.      Palpations: Abdomen is soft.      Tenderness: There is no abdominal tenderness. There is no guarding or rebound.   Musculoskeletal:         General: No tenderness.   Skin:     General: Skin is warm and dry.      Findings: No rash (On exposed body surfaces).   Neurological:      Mental Status: He is alert and oriented to person, place, and time.      Cranial Nerves: Cranial nerves are intact.      Sensory: Sensation is intact.      Motor: Tremor present.      Coordination: Coordination is intact. Romberg sign negative. Coordination normal. Finger-Nose-Finger Test normal.   Psychiatric:         Behavior: Behavior normal.         Thought Content: Thought content normal.         DIAGNOSTIC RESULTS   LABS:    Results for orders placed or performed during the hospital encounter of 07/29/18   CBC Auto Differential   Result Value Ref Range    WBC 5.1 4.0 - 11.0 K/uL    RBC 4.75 4.20 - 5.90 M/uL    Hemoglobin 14.3 13.5 - 17.5 g/dL    Hematocrit 25.3 66.4 - 52.5 %    MCV 87.5 80.0 - 100.0 fL    MCH 30.1 26.0 - 34.0 pg    MCHC 34.3 31.0 - 36.0 g/dL    RDW 40.3 47.4 - 25.9 %    Platelets 337 135 - 450 K/uL    MPV 6.9 5.0 - 10.5 fL    Neutrophils % 61.3 %    Lymphocytes % 24.2 %    Monocytes % 9.9 %    Eosinophils % 4.0 %    Basophils % 0.6 %    Neutrophils Absolute 3.1 1.7 - 7.7 K/uL    Lymphocytes Absolute 1.2 1.0 - 5.1 K/uL    Monocytes Absolute 0.5 0.0 - 1.3 K/uL    Eosinophils Absolute 0.2 0.0 - 0.6 K/uL    Basophils Absolute 0.0 0.0 - 0.2 K/uL   Comprehensive Metabolic Panel w/ Reflex to MG   Result Value Ref Range    Sodium 139 136 - 145 mmol/L    Potassium reflex Magnesium 4.3 3.5 - 5.1 mmol/L    Chloride 101 99 - 110 mmol/L    CO2 27 21 - 32 mmol/L    Anion Gap 11 3 - 16    Glucose 96 70 - 99 mg/dL    BUN 22 (H) 7 - 20 mg/dL    CREATININE 0.8 (L) 0.9 - 1.3 mg/dL    GFR Non-African American >60 >60    GFR African American >60 >60    Calcium 9.5 8.3 - 10.6 mg/dL    Total Protein 6.7 6.4 - 8.2 g/dL    Alb  4.3 3.4 - 5.0 g/dL    Albumin/Globulin Ratio 1.8 1.1 - 2.2    Total Bilirubin 0.5 0.0 - 1.0 mg/dL    Alkaline Phosphatase 54 40 - 129 U/L    ALT 22 10 - 40 U/L    AST 20 15 -  37 U/L    Globulin 2.4 g/dL   Troponin   Result Value Ref Range    Troponin <0.01 <0.01 ng/mL       All other labs were within normal range or not returned as of this dictation.    EKG: All EKG's are interpreted by the Emergency Department Physician who either signs orCo-signs this chart in the absence of a cardiologist.    EKG visualized preliminarily interpreted by myself shows sinus rhythm at a rate of 74.  Rightward axis with a right bundle branch block.  No acute injury or acute ischemia is appreciated.    RADIOLOGY:   Non-plain film images such as CT, Ultrasound and MRI are read by the radiologist. Plain radiographic images are visualized and preliminarily interpreted by the  EDProvider with the below findings:    Ct Head Wo Contrast    Result Date: 07/29/2018  EXAMINATION: CT OF THE HEAD WITHOUT CONTRAST  07/29/2018 11:17 am TECHNIQUE: CT of the head was performed without the administration of intravenous contrast. Dose modulation, iterative reconstruction, and/or weight based adjustment of the mA/kV was utilized to reduce the radiation dose to as low as reasonably achievable. COMPARISON: None. HISTORY: ORDERING SYSTEM PROVIDED HISTORY: lightheaded, dizzy.  Episode of slurred speech.  Headache on right TECHNOLOGIST PROVIDED HISTORY: Reason for exam:->lightheaded, dizzy.  Episode of slurred speech.  Headache on right Has a "code stroke" or "stroke alert" been called?->No FINDINGS: BRAIN/VENTRICLES: There is no acute intracranial hemorrhage, mass effect or midline shift.  No abnormal extra-axial fluid collection.  The gray-white differentiation is maintained without evidence of an acute infarct.  There is no evidence of hydrocephalus. ORBITS: The visualized portion of the orbits demonstrate no acute abnormality. SINUSES: The visualized  paranasal sinuses and mastoid air cells demonstrate no acute abnormality. SOFT TISSUES/SKULL:  No acute abnormality of the visualized skull or soft tissues.     No acute intracranial abnormality.     Xr Chest Portable    Result Date: 07/29/2018  EXAMINATION: ONE XRAY VIEW OF THE CHEST 07/29/2018 10:03 am COMPARISON: 11/08/2008 HISTORY: ORDERING SYSTEM PROVIDED HISTORY: lightheaded dizzy TECHNOLOGIST PROVIDED HISTORY: Reason for exam:->lightheaded dizzy Reason for Exam: lightheaded dizzy Acuity: Acute Type of Exam: Initial FINDINGS: Normal heart size and pulmonary vasculature.  No focal consolidations, pleural effusions, or pneumothorax.     Unremarkable.     Cta Head Neck W Contrast    Result Date: 07/29/2018  EXAMINATION: CTA OF THE HEAD AND NECK WITH CONTRAST 07/29/2018 11:17 am TECHNIQUE: CTA of the head and neck was performed with the administration of intravenous contrast. Multiplanar reformatted images are provided for review.  MIP images are provided for review. Stenosis of the internal carotid arteries measured using NASCET criteria. Dose modulation, iterative reconstruction, and/or weight based adjustment of the mA/kV was utilized to reduce the radiation dose to as low as reasonably achievable. 3D surface reformatted and curved MIP images were submitted for review. COMPARISON: None HISTORY: ORDERING SYSTEM PROVIDED HISTORY: Episode of lightheadedness with right-sided headache and an episode of slurred speech TECHNOLOGIST PROVIDED HISTORY: Reason for exam:->Episode of lightheadedness with right-sided headache and an episode of slurred speech FINDINGS: CTA NECK: AORTIC ARCH/ARCH VESSELS: There is 4 vessel aortic arch with direct origin of the left vertebral artery from the thoracic aorta.  No dissection or arterial injury.  No significant stenosis of the brachiocephalic or subclavian arteries. CAROTID ARTERIES: No dissection, arterial injury, or hemodynamically significant stenosis by NASCET criteria. VERTEBRAL  ARTERIES: No dissection, arterial injury, or significant  stenosis. Right vertebral artery is dominant. SOFT TISSUES: The lung apices are clear.  No cervical or superior mediastinal lymphadenopathy.  The larynx and pharynx are unremarkable.  No acute abnormality of the salivary and thyroid glands. BONES: No acute osseous abnormality. CTA HEAD: ANTERIOR CIRCULATION: No significant stenosis of the intracranial internal carotid, anterior cerebral, or middle cerebral arteries. No aneurysm. Anterior communicating artery is present. POSTERIOR CIRCULATION: No significant stenosis of the vertebral, basilar, or posterior cerebral arteries. No aneurysm.  Left vertebral artery terminates as a PICA.  Right posterior communicating artery is present. OTHER: No dural venous sinus thrombosis on this non-dedicated study. BRAIN: No mass effect or midline shift. No extra-axial fluid collection. The gray-white differentiation is maintained.     Unremarkable CTA of the head and neck.           PROCEDURES   Unless otherwise noted below, none     Procedures    CRITICAL CARE TIME   N/A    CONSULTS:  None    EMERGENCY DEPARTMENT COURSE and DIFFERENTIAL DIAGNOSIS/MDM:   Vitals:    Vitals:    07/29/18 1030 07/29/18 1046 07/29/18 1101 07/29/18 1117   BP: (!) 128/90 130/70 120/79 122/82   Pulse:       Resp:       Temp:       TempSrc:       SpO2: 100% 100% 100% 100%       Patient was given the following medications:  Medications   iopamidol (ISOVUE-370) 76 % injection 75 mL (75 mLs Intravenous Given 07/29/18 1124)       Patient's wife did arrive and help me get more specific history.  She noted definite left-sided facial droop and dysarthria.  Currently the patient has what appears to me to be a normal neurologic exam.  He does state that he just feels a little bit off.  He did have a headache like this so could be a complex migraine.  However that that is a diagnosis by exclusion at this time and I need to consider that he had a TIA.  So I did  recommend that he stay in the hospital and get further additional testing.      FINAL IMPRESSION      1. Dysarthria    2. Facial droop          DISPOSITION/PLAN    DISPOSITION   Decision to admit      PATIENT REFERRED TO:  No follow-up provider specified.    DISCHARGE MEDICATIONS:  New Prescriptions    No medications on file       DISCONTINUED MEDICATIONS:  Discontinued Medications    No medications on file              (Please note that portions of this note were completed with a voice recognition program.  Efforts were made to editthe dictations but occasionally words are mis-transcribed.)    Synetta Fail, MD (electronically signed)           Synetta Fail, MD  07/29/18 1341

## 2018-07-29 NOTE — Progress Notes (Signed)
NAME:  Peter Rhodes  DATE OF BIRTH:  Jul 20, 1980  MEDICAL RECORD NUMBER:  [id number]  TODAYS DATE:  07/29/2018    Discussed personal risk factors for Stroke /TIA with patient/family, and ways to reduce the risk for a recurrent stroke. Patient's personal risk factors which were identified are:     []  Alcohol Abuse: check with your physician before any alcohol consumption.   []  Atrial fibrillation: may cause blood clots.  []  Drug Abuse: Seek help, talk with your doctor  []  Clotting Disorder  []  Diabetes  [x]  Family history of stroke or heart disease  []  High Blood Pressure/Hypertension: work with your physician.  [x]  High cholesterol: monitor cholesterol levels with your physician. Noted in 05/2017  []  Overweight/Obesity: work with your physician for your ideal body weight.  []  Physical Inactivity: get regular exercise as directed by your physician.   []  Personal history of previous TIA or stroke  []  Poor Diet; decrease salt (sodium) in your diet, follow diet directed by physician.   [x]  Smoking: Cigarette/Cigar: stop smoking.         Advised pt. that you can reduce your risk for stroke/TIA by modifying/controlling the risk factors that you have. Pt.advised to take the medications as prescribed, which will be detailed in the discharge instructions, and to not stop taking them without consulting their physician. In addition, pt. advised to maintain a healthy diet, exercise regularly and to not smoke.      [facility]'s Stroke treatment and prevention, Managing your recovery  notebook  provided and/or reviewed  with patient/family.  The notebook includes, but not limited to, sections addressing warning signs & symptoms of a stroke, which are: sudden numbness or weakness especially on one side of the body, sudden confusion, difficulty speaking or understanding, sudden changes in vision, sudden dizziness or loss of balance/ coordination, or sudden severe headache.  The need to call EMS (911) immediately if signs &  symptoms occur is emphasized . The notebook also provides education on Stroke community resources and stroke advocacy.    The need for follow-up after discharge was highlighted with patient/family with them being able to repeat understanding of the importance of this.      Electronically signed by Dorita Fray, RN on 07/29/2018 at 11:26 PM

## 2018-07-29 NOTE — ED Notes (Signed)
Bed: B-07  Expected date:   Expected time:   Means of arrival: [facility]  Comments:  [address]     Felipa Evener, [place]  07/29/18 [phone]

## 2018-07-29 NOTE — ED Triage Notes (Signed)
Pt reports that he was cooking breakfast and started twitching with blurry vision. Pt became diaphoretic with slurred speech. No facial droop or slurred speech noted upon arrival to ED. Pt reports feeling he has " a thick tongue". Complains of an ache to Rt side of his head since arriving to ED

## 2018-07-30 LAB — LIPID PANEL
Cholesterol, Total: 199 mg/dL (ref 0–199)
HDL: 61 mg/dL — ABNORMAL HIGH (ref 40–60)
LDL Calculated: 117 mg/dL — ABNORMAL HIGH (ref <100)
Triglycerides: 103 mg/dL (ref 0–150)
VLDL Cholesterol Calculated: 21 mg/dL

## 2018-07-30 LAB — EKG 12-LEAD
Atrial Rate: 74 {beats}/min
P Axis: 67 degrees
P-R Interval: 180 ms
Q-T Interval: 432 ms
QRS Duration: 150 ms
QTc Calculation (Bazett): 479 ms
R Axis: 260 degrees
T Axis: 62 degrees
Ventricular Rate: 74 {beats}/min

## 2018-07-30 LAB — CBC
Hematocrit: 43 % (ref 40.5–52.5)
Hemoglobin: 14.7 g/dL (ref 13.5–17.5)
MCH: 30 pg (ref 26.0–34.0)
MCHC: 34.2 g/dL (ref 31.0–36.0)
MCV: 87.8 fL (ref 80.0–100.0)
MPV: 7 fL (ref 5.0–10.5)
Platelets: 334 10*3/uL (ref 135–450)
RBC: 4.89 M/uL (ref 4.20–5.90)
RDW: 13.1 % (ref 12.4–15.4)
WBC: 7.4 10*3/uL (ref 4.0–11.0)

## 2018-07-30 LAB — HOMOCYSTEINE: Homocysteine: 9 umol/L (ref 0–10)

## 2018-07-30 LAB — HEMOGLOBIN A1C
Hemoglobin A1C: 5.1 %
eAG: 99.7 mg/dL

## 2018-07-30 LAB — APTT: aPTT: 29.2 s (ref 24.2–36.2)

## 2018-07-30 LAB — FACTOR 8 ACTIVITY: Factor VIII Activity: 152 % — ABNORMAL HIGH (ref 50–150)

## 2018-07-30 LAB — PROTIME-INR
INR: 0.97 (ref 0.86–1.14)
Protime: 11.2 s (ref 10.0–13.2)

## 2018-07-30 MED ORDER — KETOROLAC TROMETHAMINE 30 MG/ML IJ SOLN
30 MG/ML | Freq: Four times a day (QID) | INTRAMUSCULAR | Status: DC | PRN
Start: 2018-07-30 — End: 2018-08-01
  Administered 2018-07-30 (×2): 30 mg via INTRAVENOUS

## 2018-07-30 MED ORDER — PERFLUTREN LIPID MICROSPHERE IV SUSP
Freq: Once | INTRAVENOUS | Status: DC | PRN
Start: 2018-07-30 — End: 2018-08-01

## 2018-07-30 MED FILL — ACETAMINOPHEN 325 MG PO TABS: 325 mg | ORAL | Qty: 2

## 2018-07-30 MED FILL — KETOROLAC TROMETHAMINE 30 MG/ML IJ SOLN: 30 mg/mL | INTRAMUSCULAR | Qty: 1

## 2018-07-30 MED FILL — ASPIRIN LOW DOSE 81 MG PO TBEC: 81 MG | ORAL | Qty: 1

## 2018-07-30 MED FILL — ATORVASTATIN CALCIUM 80 MG PO TABS: 80 MG | ORAL | Qty: 1

## 2018-07-30 NOTE — Progress Notes (Signed)
[facility]  Personalized Stroke Treatment Plan  My Stroke Type:   []  Ischemic Stroke (Blockage of blood flow to the brain)     []  TIA - Transient Ischemic Attack (mini-stroke)    Personal risk factors you can control include:    []  Alcohol Abuse: check with your physician before any alcohol consumption.   []  Atrial fibrillation: may cause blood clots.  []  Drug Abuse: Seek help, talk with your doctor  []  Clotting Disorder  []  Diabetes  [x]  Family history of stroke or heart disease  []  High Blood Pressure/Hypertension: work with your physician.  [x]  High cholesterol: monitor cholesterol levels with your physician.   []  Overweight/Obesity: work with your physician for your ideal body weight.  []  Physical Inactivity: get regular exercise as directed by your physician.   []  Personal history of previous TIA or stroke  []  Poor Diet; decrease salt (sodium) in your diet, follow diet directed by physician.   [x]  Smoking: Cigarette/Cigar: stop smoking.     Follow up with your physician is important after discharge.    TAKE all medications as prescribed. Do not stop taking any medications   without talking to your physician.    BE FAST is a simple way to remember the main symptoms of stroke. Recognizing these symptoms helps you know when to call for medical help.  BE FAST stands for:                                                 B Balance problems                                                 E Eyes, vision lost        F  Face Drooping      A  Arm Weakness        S  Speech Difficulty      T  Time to Call 9-1-1  DO NOT DELAY THIS!    Educated patient and/or family on personal risk factors for stroke/TIA.  Included ways to reduce the risk for recurrent stroke.    [facility]'s Stroke treatment and prevention, Managing your recovery  notebook  provided to patient.  The notebook includes, but not limited to, sections addressing warning signs & symptoms of a stroke. The need to call EMS (911) immediately if signs &  symptoms occur is emphasized .  Medication information will be reviewed at discharge.  The notebook also provides education on Stroke community resources and stroke advocacy.    Electronically signed by Electronically signed by Donette Larry, RN on 07/30/2018 at 10:02 AM

## 2018-07-30 NOTE — Care Coordination-Inpatient (Signed)
INITIAL CASE MANAGEMENT ASSESSMENT    Reviewed chart, met with patient to assess possible discharge needs. Explained Case Management role/services.     Living Situation: lives at home with his spouse     ADLs: independent     DME: none    PT/OT Recs: home independently     Active Services: none     Transportation: active driver-- spouse can transport home at [employer]      Medications: no issues-- uses Therapist, music    PCP: Dr Anderson Malta in [place]    PLAN/COMMENTS: patient plans to return home with his spouse-- denies dc needs    SW/CM provided contact information for patient or family to call with any questions.  SW/CM will follow and assist as needed.  Electronically signed by Lavonda Jumbo on 07/30/2018 at 11:57 AM

## 2018-07-30 NOTE — Progress Notes (Signed)
Occupational Therapy    OT referral received and appreciated. Patient's chart reviewed. Per PT and RN, patient independent with ADLs and mobility. No acute OT needs at this time. Will sign off. Please re-consult if new needs arise.     Electronically signed by Cecil Cranker, OTR/L on 07/30/2018 at 9:44 AM

## 2018-07-30 NOTE — Consults (Signed)
Neurology Consult Note  Reason for Consult: slurred speech, left facial droop    Chief complaint: slurred speech, facial weakness    Dr Brent General, DO asked me to see Peter Rhodes in consultation for evaluation of slurred speech, left facial droop    History of Present Illness:  Peter Rhodes is a 38 y.o. male who presents with slurred speech and facial weakness.      I obtained my information via interview w/ the patient and his wife at the bedside, supplemented by chart review.      Around 0845 yesterday morning, the patient was making breakfast when he abruptly felt lightheaded, was experiencing blurry vision, and his legs were heavy.  He was complaining of a bit of a right sided headache.  His wife came to check on him and he was slurring his speech w/ left facial weakness.  EMS was called and he was subsequently transported to the ED in order to be evaluated.      Currently, he has improved, though thinks his facial weakness is not back to normal.      No hx of stroke.  His grandmother had a stroke in her 40s.  No personal/family hx of hypercoagulable disorders.  He does smoke.      Medical History:  History reviewed. No pertinent past medical history.     History reviewed. No pertinent surgical history.     Scheduled Meds:  ??? sodium chloride flush  10 mL Intravenous 2 times per day   ??? enoxaparin  40 mg Subcutaneous Daily   ??? aspirin  81 mg Oral Daily    Or   ??? aspirin  300 mg Rectal Daily   ??? atorvastatin  80 mg Oral Nightly     No medications prior to admission.    No Known Allergies    History reviewed. No pertinent family history.    Social History     Tobacco Use   Smoking Status Current Some Day Smoker   ??? Packs/day: 0.25   Smokeless Tobacco Never Used     Social History     Substance and Sexual Activity   Drug Use Never     Social History     Substance and Sexual Activity   Alcohol Use Yes     ROS:  Constitutional- No weight loss or fevers  Eyes- No diplopia. No  photophobia.  Ears/nose/throat- No dysphagia. + Dysarthria  Cardiovascular- No palpitations. No chest pain  Respiratory- No dyspnea. No Cough  Gastrointestinal- No Abdominal pain. No Vomiting.  Genitourinary- No incontinence. No urinary retention  Musculoskeletal- No myalgia. No arthralgia  Skin- No rash. No easy bruising.  Psychiatric- No depression. No anxiety  Endocrine- No diabetes. No thyroid issues.  Hematologic- No bleeding difficulty. No fatigue  Neurologic- No weakness. + Headache.    Exam:  Blood pressure 112/77, pulse 66, temperature 98.3 ??F (36.8 ??C), temperature source Oral, resp. rate 16, height 6\' 2"  (1.88 m), weight 183 lb 6.8 oz (83.2 kg), SpO2 98 %.  Constitutional    Vital signs: BP, HR, and RR reviewed   General alert, no distress, well-nourished  Eyes: unable to visualize the fundi  Cardiovascular: pulses symmetric in all 4 extremities.    Psychiatric: cooperative with examination, no psychotic behavior noted.  Neurologic  Mental status:   orientation to person, place, time.     General fund of knowledge grossly intact   Memory grossly intact   Attention intact as able to attend  well to the exam     Language fluent in conversation. No aphasia.     Comprehension intact; follows simple commands  Cranial nerves:   CN2: visual fields full  CN 3,4,6: extraocular muscles intact  CN5: facial sensation symmetric  CN7: face symmetric without dysarthria  CN8: hearing grossly intact  CN9: palate elevated symmetrically  CN11: trap full strength on shoulder shrug  CN12: tongue midline with protrusion  Strength: No pronator drift. Good strength in all 4 extremities   Deep tendon reflexes: normal in all 4 extremities  Sensory: light touch intact in all 4 extremities.    Cerebellar/coordination: finger nose finger normal without ataxia  Tone: normal in all 4 extremities  Gait: normal gait    Labs  LDL 117  HgA1c 5.1    Glucose 96  Na 139  K 4.3  BUN 22  Creatinine 0.8  LFTs normal    WBC 7.4K  Hg  14.7  Platelets 334    Studies  MRI brain w/o 07/29/18, independently reviewed  Acute right frontal lobe infarct, extending into the frontal operculum.        CTA head/neck 07/29/18, independently reviewed  Unremarkable.     EKG 07/29/18  NSR    Impression  1.  Acute right MCA infarct.    2.  Tobacco abuse, smoker.    3.  Hyperlipidemia.   4.  Headache.      Recommendations  1.  TEE.    2.  Will send hypercoagulable studies.    3.  81 mg aspirin daily.    4.  High intensity statin.    5.  Smoking cessation.    6.  Telemetry.    7.  Needs follow up w/ Neurology after discharge, ideally a stroke specialist.      Standley Dakins NP  [facility]    A copy of this note was provided for Dr Brent General, DO

## 2018-07-30 NOTE — Progress Notes (Signed)
Speech Language/Pathology   SPEECH LANGUAGE AND CLINICAL BEDSIDE SWALLOWING EVALUATION   Speech Therapy Department     Peter Rhodes  AGE: 38 y.o.   GENDER: male  DOB: 11/24/1980  [id number]  EPISODE DATE:  07/29/2018    Chief Complaint   Patient presents with   ??? Shaking     Started this morning around 845, pt was cooking breakfast, has since slowed down a little bit     PAST MEDICAL HISTORY  History reviewed. No pertinent past medical history.    PAST SURGICAL HISTORY  History reviewed. No pertinent surgical history.    ALLERGIES  No Known Allergies    CHART REVIEW:  07/29/2018 admitted with CVA: The patient is a 38 y.o. male who presents to [facility] with an acute onset of a rt side posterior headache followed about 10 minutes later by slurred speech and a left side facial droop. His wife called 911. He had no other focal weakness or paresthesias. In the ed labs were normal, vitals stable. Ct head was nonacute. He has never had anything like this before. He has no h/o migraines. His father had a cabg in his 50s. Grandmother and ischemic cva in her 7s. When I saw him his speech had improved bu he had a decreased smile on the left.  07/29/2018 Brain MRI: Acute right frontal lobe infarct, extending into the frontal operculum    General  Chart Reviewed: Yes  Patient assessed for rehabilitation services?: Yes  Family / Caregiver Present: Yes(wife [name])    Primary Complaint  Comments: endorses slurred speech that is reported as resolved    Subjective  Subjective: Accepted and tolerated evaluation at bedside    Pain Assessment  Patient Currently in Pain: No    Social/Functional History  Lives With: (spouse, 82 year-old daughter)  Engineer, water Assistance: Independent  Active Driver: Yes  Education: graduated high school  Occupation: Full time employment  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        Dysphagia Treatment Diagnosis: Oropharyngeal Dysphagia   Speech Language Treatment Diagnosis: Cognitive-Language      IMPRESSIONS  ?? Cognitive Diagnosis: Mild cognitive-language impairments characterized by the need for extra time for calculations patient is accustomed to completing with ease.  Recommend continued assessment of complex, abstract cognitive-language function/skills.  ?? Speech Diagnosis: No apparent dysarthria.  Wife reports patient close to baseline.  ?? Dysphagia Diagnosis: Swallow function appears grossly intact    Recommended Diet:  Diet Solids Recommendation: Regular  Liquid Consistency Recommendation: Thin  Strategies: 90 degree positioning with all p.o. intake    Plan/Recommendations:   Requires SLP Intervention: Yes  Duration/Frequency of Treatment: ST to tx 3-5 tiems per week for cogntive-language during acute admission  Dysphagia Goals  The patient will tolerate recommended diet without observed clinical signs of aspiration,   The patient/caregiver will demonstrate understanding of compensatory strategies for improved swallowing safety.  Speech and Language Goals  Goal 1: Patient will improve numeric reasoning to independent.  Goal 2: Patient will tolerate ongoing evaluation with addition goals to be determined as appropriate.  Prognosis  Good for ST    Education  Consulted and agree with results and recommendations: Patient, Family member, RN  Patient Education: Completed on results/recs/plan  Patient Education Response: Verbalizes understanding    Discharge Recommendations:    D/C Recommendations: (skilled ST  recommended upon discharge)    ASSESSMENT/OBJECTIVE  Oral/Motor  Oral Motor: Within functional limits  Auditory Comprehension  Comprehension: Within Functional Limits  Reading Comprehension  Reading Status: Within functional limits  Verbal Expression  Verbal Expression: Within functional limits  Written Expression  Dominant Hand: Right  Written Expression: Within Functional Limits  Motor Speech  Motor Speech: Within Functional Limits(wife reports slightly different than baseline)  Pragmatics/Social Functioning  Pragmatics: Within functional limits  Cognitive-Language  Overall Orientation Status: Within Functional Limits  Attention: Within Functional Limits  Memory: Within Functional Limits  Problem Solving: Within Functional Limits  Numeric Reasoning  ?? Calculations: Mild  ?? Money Management: Mild  ?? Time: Mild  Abstract Reasoning: Within Functional Limits  Safety/Judgement: Within Functional Limits  Dysphagia  Oral Phase: WFL  Pharyngeal Phase: WFL      Please accept this as Speech Therapy Discharge status, if pt is discharged prior to next therapy session.      SLP Individual Minutes  Time In: 1115  Time Out: 1200  Minutes: 45    Timed Code Treatment: 0  Total Treatment Time: 45      Rutger Salton L. [address] [place], [place], [address]  Speech-Language Pathologist  07/30/2018 12:00 PM

## 2018-07-30 NOTE — Progress Notes (Signed)
Hospitalist Progress Note      PCP: No primary care provider on file.    Date of Admission: 07/29/2018    Chief Complaint: slurred speech, Left facial droop, headache.       Subjective:     Feels essentially back to normal.     MRI reveals large R frontal CVA.        Medications:  Reviewed    Infusion Medications   Scheduled Medications   ??? sodium chloride flush  10 mL Intravenous 2 times per day   ??? enoxaparin  40 mg Subcutaneous Daily   ??? aspirin  81 mg Oral Daily    Or   ??? aspirin  300 mg Rectal Daily   ??? atorvastatin  80 mg Oral Nightly     PRN Meds: sodium chloride flush, polyethylene glycol, promethazine, ondansetron, acetaminophen, ketorolac, perflutren lipid microspheres      Intake/Output Summary (Last 24 hours) at 07/30/2018 1344  Last data filed at 07/29/2018 2200  Gross per 24 hour   Intake 120 ml   Output 0 ml   Net 120 ml       Physical Exam Performed:    BP 117/84    Pulse 83    Temp 98.1 ??F (36.7 ??C) (Oral)    Resp 16    Ht 6\' 2"  (1.88 m)    Wt 183 lb 6.8 oz (83.2 kg)    SpO2 97%    BMI 23.55 kg/m??     General appearance: No apparent distress, appears stated age and cooperative.  HEENT: Pupils equal, round, and reactive to light. Conjunctivae/corneas clear.  Neck: Supple, with full range of motion. No jugular venous distention. Trachea midline.  Respiratory:  Normal respiratory effort. Clear to auscultation, bilaterally without Rales/Wheezes/Rhonchi.  Cardiovascular: Regular rate and rhythm with normal S1/S2 without murmurs, rubs or gallops.  Abdomen: Soft, non-tender, non-distended with normal bowel sounds.  Musculoskeletal: No clubbing, cyanosis or edema bilaterally.  Full range of motion without deformity.  Skin: Skin color, texture, turgor normal.  No rashes or lesions.  Neurologic:  Neurovascularly intact without any focal sensory/motor deficits. Cranial nerves: II-XII intact, grossly non-focal.  Psychiatric: Alert and oriented, thought content appropriate, normal insight  Capillary Refill:  Brisk,< 3 seconds   Peripheral Pulses: +2 palpable, equal bilaterally       Labs:   Recent Labs     07/29/18  1030 07/30/18  0702   WBC 5.1 7.4   HGB 14.3 14.7   HCT 41.5 43.0   PLT 337 334     Recent Labs     07/29/18  1029   NA 139   K 4.3   CL 101   CO2 27   BUN 22*   CREATININE 0.8*   CALCIUM 9.5     Recent Labs     07/29/18  1029   AST 20   ALT 22   BILITOT 0.5   ALKPHOS 54     No results for input(s): INR in the last 72 hours.  Recent Labs     07/29/18  1029   TROPONINI <0.01       Urinalysis:    No results found for: NITRU, WBCUA, BACTERIA, RBCUA, BLOODU, SPECGRAV, GLUCOSEU    Radiology:  MRI brain without contrast   Final Result   Acute right frontal lobe infarct, extending into the frontal operculum.      The findings were sent to the Radiology Results Communication Center at 7:33   pm on  2/23/2020to be communicated to a licensed caregiver.         CT Head WO Contrast   Final Result   No acute intracranial abnormality.         CTA HEAD NECK W CONTRAST   Final Result   Unremarkable CTA of the head and neck.         XR CHEST PORTABLE   Final Result   Unremarkable.                 Assessment/Plan:    Active Hospital Problems    Diagnosis   ??? Acute CVA (cerebrovascular accident) (HCC) [I63.9]   ??? Slurred speech [R47.81]       Acute Ischemic CVA  No prior hx   Smokes, otherwise no med problems, not on any daily meds prior to admission.   Plan:    - discussed with neuro.    - cont ASA and statin.    - ECHO pending.    - will likely need TEE.    - cont telemetry monitor.    - CVA protocol         Headache - due to above.           DVT Prophylaxis: lovenox  Diet: DIET GENERAL;  Code Status: Full Code      Dispo - inpatient.    Bernette Mayers, MD

## 2018-07-30 NOTE — Progress Notes (Signed)
Physical Therapy    Facility/Department: [facility]  Initial Assessment and Discharge    NAME: Peter Rhodes  DOB: Apr 01, 1981  MRN: [id number]    Date of Service: 07/30/2018    Discharge Recommendations:  Home independently   PT Equipment Recommendations  Equipment Needed: No    Assessment   Assessment: Pt is a 38 y/o male who presents to the hospital with severe headache, facial droop and slurred speech.  Pt was independent at baseline living with spouse. Pt currently demonstrated independence with gait, balance, and transfers. No further skilled PT indicated at this time. Anticipate patient will be safe to return home independently.  Prognosis: Good  Decision Making: Low Complexity  PT Education: PT Role;General Safety;Plan of Care  REQUIRES PT FOLLOW UP: No  Activity Tolerance  Activity Tolerance: Patient Tolerated treatment well       Patient Diagnosis(es): The primary encounter diagnosis was Dysarthria. A diagnosis of Facial droop was also pertinent to this visit.     has no past medical history on file.   has no past surgical history on file.    Restrictions  Restrictions/Precautions  Restrictions/Precautions: Up as Tolerated  Position Activity Restriction  Other position/activity restrictions: low fall risk    Subjective  General  Chart Reviewed: Yes  Patient assessed for rehabilitation services?: Yes  Additional Pertinent Hx: Per H&P, "The patient is a 38 y.o. male who presents to [facility] with an acute onset of a rt side posterior headache followed about 10 minutes later by slurred speech and a left side facial droop. His wife called 911. He had no other focal weakness or paresthesias. In the ed labs were normal, vitals stable. Ct head was nonacute. He has never had anything like this before. He has no h/o migraines. His father had a cabg in his 84s. Grandmother and ischemic cva in her 87s". MRI brain: Acute right frontal lobe infarct, extending into the frontal operculum  Family  / Caregiver Present: Yes  Referring Practitioner: Brent General, DO  Referral Date : 07/29/18  Diagnosis: slurred speech  Follows Commands: Within Functional Limits  Subjective  Subjective: Pt agreeable to evaluation. Pt reports 4/10 pain in R frontal area.  Pain Screening  Patient Currently in Pain: Yes        Orientation  Orientation  Overall Orientation Status: Within Normal Limits  Social/Functional History  Social/Functional History  Lives With: Spouse  Type of Home: House  Home Layout: Two level, Bed/Bath upstairs(1/2 bath on main level)  Home Access: Level entry  Bathroom Shower/Tub: Medical sales representative: Standard  ADL Assistance: Independent  Homemaking Assistance: Independent  Ambulation Assistance: Independent  Transfer Assistance: Independent  Active Driver: Yes  Occupation: Full time employment  Type of occupation: Airline pilot  Objective  AROM RLE (degrees)  RLE AROM: WNL  AROM LLE (degrees)  LLE AROM : WNL  Strength RLE  Strength RLE: WNL  Strength LLE  Strength LLE: WNL  Motor Control  Gross Motor?: WFL  Coordination  Heel to Shin: Normal  Sensation  Overall Sensation Status: WNL  Bed mobility  Supine to Sit: Independent  Sit to Supine: Unable to assess(up in chair at end of session)  Transfers  Sit to Stand: Independent  Stand to sit: Independent  Ambulation  Ambulation?: Yes  Ambulation 1  Surface: level tile  Device: No Device  Assistance: Independent  Quality of Gait: step through gait pattern, normal cadence, steady  Distance: 250'  Stairs/Curb  Stairs?: No(infer independent from observed mobility)     Balance  Sitting - Static: Good  Sitting - Dynamic: Good  Standing - Static: Good  Standing - Dynamic: Good      Plan   Plan  Times per week: eval only  Safety Devices  Type of devices: Call light within reach, Nurse notified(low fall risk; pt reports being independent in room)  AM-PAC Score  AM-PAC Inpatient Mobility Raw Score : 24 (07/30/18 0838)  AM-PAC Inpatient T-Scale Score : 61.14  (07/30/18 0838)  Mobility Inpatient CMS 0-100% Score: 0 (07/30/18 0838)  Mobility Inpatient CMS G-Code Modifier : [facility] (07/30/18 6962)        Goals  Short term goals  Time Frame for Short term goals: n/a  Patient Goals   Patient goals : "to go home"     Therapy Time   Individual Concurrent Group Co-treatment   Time In 0815         Time Out 0830         Minutes [address] [place], [place] [id number]

## 2018-07-30 NOTE — Plan of Care (Signed)
Problem: HEMODYNAMIC STATUS  Goal: Patient has stable vital signs and fluid balance  07/30/2018 1002 by Ebbie Latus, RN  Outcome: Ongoing     Problem: ACTIVITY INTOLERANCE/IMPAIRED MOBILITY  Goal: Mobility/activity is maintained at optimum level for patient  07/30/2018 1002 by Ebbie Latus, RN  Outcome: Ongoing     Problem: COMMUNICATION IMPAIRMENT  Goal: Ability to express needs and understand communication  07/30/2018 1002 by Ebbie Latus, RN  Outcome: Ongoing     Problem: SAFETY  Goal: Free from accidental physical injury  07/30/2018 1002 by Ebbie Latus, RN  Outcome: Ongoing     Problem: SAFETY  Goal: Free from intentional harm  07/30/2018 1002 by Ebbie Latus, RN  Outcome: Ongoing     Problem: DAILY CARE  Goal: Daily care needs are met  07/30/2018 1002 by Ebbie Latus, RN  Outcome: Ongoing     Problem: PAIN  Goal: Patient's pain/discomfort is manageable  07/30/2018 1002 by Ebbie Latus, RN  Outcome: Ongoing     Problem: SKIN INTEGRITY  Goal: Skin integrity is maintained or improved  07/30/2018 1002 by Ebbie Latus, RN  Outcome: Ongoing     Problem: KNOWLEDGE DEFICIT  Goal: Patient/S.O. demonstrates understanding of disease process, treatment plan, medications, and discharge instructions.  07/30/2018 1002 by Ebbie Latus, RN  Outcome: Ongoing     Problem: DISCHARGE BARRIERS  Goal: Patient's continuum of care needs are met  07/30/2018 1002 by Ebbie Latus, RN  Outcome: Ongoing     Problem: Pain:  Goal: Pain level will decrease  Description  Pain level will decrease  07/30/2018 1002 by Ebbie Latus, RN  Outcome: Ongoing     Problem: Pain:  Goal: Control of acute pain  Description  Control of acute pain  07/30/2018 1002 by Ebbie Latus, RN  Outcome: Ongoing     Problem: Pain:  Goal: Control of chronic pain  Description  Control of chronic pain  07/30/2018 1002 by Ebbie Latus, RN  Outcome: Ongoing

## 2018-07-31 LAB — ECHOCARDIOGRAM COMPLETE 2D W DOPPLER W COLOR: Left Ventricular Ejection Fraction: 53

## 2018-07-31 MED FILL — ASPIRIN LOW DOSE 81 MG PO TBEC: 81 MG | ORAL | Qty: 1

## 2018-07-31 MED FILL — ACETAMINOPHEN 325 MG PO TABS: 325 mg | ORAL | Qty: 2

## 2018-07-31 MED FILL — ATORVASTATIN CALCIUM 80 MG PO TABS: 80 MG | ORAL | Qty: 1

## 2018-07-31 NOTE — Plan of Care (Signed)
Problem: HEMODYNAMIC STATUS  Goal: Patient has stable vital signs and fluid balance  Outcome: Ongoing     Problem: ACTIVITY INTOLERANCE/IMPAIRED MOBILITY  Goal: Mobility/activity is maintained at optimum level for patient  Outcome: Ongoing     Problem: COMMUNICATION IMPAIRMENT  Goal: Ability to express needs and understand communication  Outcome: Ongoing     Problem: SAFETY  Goal: Free from accidental physical injury  Outcome: Ongoing  Goal: Free from intentional harm  Outcome: Ongoing     Problem: DAILY CARE  Goal: Daily care needs are met  Outcome: Ongoing     Problem: PAIN  Goal: Patient's pain/discomfort is manageable  Outcome: Ongoing     Problem: SKIN INTEGRITY  Goal: Skin integrity is maintained or improved  Outcome: Ongoing     Problem: KNOWLEDGE DEFICIT  Goal: Patient/S.O. demonstrates understanding of disease process, treatment plan, medications, and discharge instructions.  Outcome: Ongoing     Problem: DISCHARGE BARRIERS  Goal: Patient's continuum of care needs are met  Outcome: Ongoing     Problem: Pain:  Goal: Pain level will decrease  Description  Pain level will decrease  Outcome: Ongoing  Goal: Control of acute pain  Description  Control of acute pain  Outcome: Ongoing  Goal: Control of chronic pain  Description  Control of chronic pain  Outcome: Ongoing     Problem: Falls - Risk of:  Goal: Will remain free from falls  Description  Will remain free from falls  Outcome: Ongoing  Goal: Absence of physical injury  Description  Absence of physical injury  Outcome: Ongoing

## 2018-07-31 NOTE — Progress Notes (Signed)
Progress Note    Updates  No significant events overnight.    Patient feels OK.  No complaints.      Current Facility-Administered Medications:   ???  sodium chloride flush 0.9 % injection 10 mL, 10 mL, Intravenous, 2 times per day, Amy R Hurst, DO, 10 mL at 07/30/18 2100  ???  sodium chloride flush 0.9 % injection 10 mL, 10 mL, Intravenous, PRN, Amy R Hurst, DO  ???  polyethylene glycol (GLYCOLAX) packet 17 g, 17 g, Oral, Daily PRN, Amy R Hurst, DO  ???  promethazine (PHENERGAN) tablet 12.5 mg, 12.5 mg, Oral, Q6H PRN, Amy R Hurst, DO  ???  ondansetron (ZOFRAN) injection 4 mg, 4 mg, Intravenous, Q6H PRN, Amy R Hurst, DO  ???  enoxaparin (LOVENOX) injection 40 mg, 40 mg, Subcutaneous, Daily, Amy R Hurst, DO  ???  aspirin EC tablet 81 mg, 81 mg, Oral, Daily, 81 mg at 07/30/18 0851 **OR** aspirin suppository 300 mg, 300 mg, Rectal, Daily, Amy R Hurst, DO  ???  atorvastatin (LIPITOR) tablet 80 mg, 80 mg, Oral, Nightly, Amy R Hurst, DO, 80 mg at 07/30/18 2059  ???  acetaminophen (TYLENOL) tablet 650 mg, 650 mg, Oral, Q4H PRN, Amy R Hurst, DO, 650 mg at 07/31/18 0042  ???  ketorolac (TORADOL) injection 30 mg, 30 mg, Intravenous, Q6H PRN, Amy R Hurst, DO, 30 mg at 07/30/18 1839  ???  perflutren lipid microspheres (DEFINITY) injection 1.65 mg, 1.5 mL, Intravenous, ONCE PRN, Cherylin Mylar, APRN - CNP    Exam  Blood pressure 136/75, pulse 103, temperature 98.3 ??F (36.8 ??C), temperature source Oral, resp. rate 18, height 6\' 2"  (1.88 m), weight 182 lb 1.6 oz (82.6 kg), SpO2 96 %.  Constitutional                          Vital signs: BP, HR, and RR reviewed            General alert, no distress, well-nourished  Eyes: unable to visualize the fundi  Cardiovascular: pulses symmetric in all 4 extremities.    Psychiatric: cooperative with examination, no psychotic behavior noted.  Neurologic  Mental status:   orientation to person, place              Attention intact as able to attend well to the exam                Language fluent in conversation.  No aphasia.                Comprehension intact; follows simple commands  Cranial nerves:   CN2: visual fields full  CN 3,4,6: extraocular muscles intact  CN7: face symmetric without dysarthria  CN8: hearing grossly intact  CN11: trap full strength on shoulder shrug  CN12: tongue midline with protrusion  Strength: No pronator drift. Good strength in all 4 extremities   Sensory: light touch intact in all 4 extremities.    Cerebellar/coordination: finger nose finger normal without ataxia  Tone: normal in all 4 extremities  Gait: normal gait    ROS  Constitutional- No weight loss or fevers  Eyes- No diplopia. No photophobia.  Ears/nose/throat- No dysphagia. No Dysarthria  Cardiovascular- No palpitations. No chest pain  Respiratory- No dyspnea. No Cough  Gastrointestinal- No Abdominal pain. No Vomiting.  Genitourinary- No incontinence. No urinary retention  Musculoskeletal- No myalgia. No arthralgia  Skin- No rash. No easy bruising.  Psychiatric- No depression. No anxiety  Endocrine-  No diabetes. No thyroid issues.  Hematologic- No bleeding difficulty. No fatigue  Neurologic- No weakness. No Headache.    Labs  LDL 117  HgA1c 5.1    Studies  MRI brain w/o 07/29/18  Acute right frontal lobe infarct, extending into the frontal operculum.      ??  CTA head/neck 07/29/18  Unremarkable.   ??  EKG 07/29/18  NSR    Impression  1.  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  He is asymptomatic.    2.  Tobacco abuse, smoker.    3.  Mildly elevated LDL.      Recommendations  1.  TEE.    2.  Hypercoagulable studies pending.    3.  81 mg aspirin, high intensity statin daily.    4.  Smoking cessation.    5.  Telemetry.    6.  Follow up w/ stroke specialist when discharged, either Dr. Jones Broom w/ [facility] or UC.     Standley Dakins NP  [facility]    A copy of this note was provided for Dr Bernette Mayers, MD

## 2018-07-31 NOTE — Progress Notes (Signed)
Speech Language Pathology  Facility/Department: [facility]  Daily Treatment Note  NAME: Peter Rhodes  DOB: [birth year]  MRN: [id number]    Date of Eval: 07/31/2018  Evaluating Therapist:Arianna Delsanto Doristine Mango, MS CCC/SLP [phone]  Speech Language Pathologist      Patient Diagnosis(es): has Slurred speech and Acute CVA (cerebrovascular accident) (HCC) on their problem list.  Onset Date: 07/30/2018    Primary Complaint:  Headache is improved    Pain:  Pain Assessment  Pain Assessment: 0-10  Pain Level: 0    Oral Motor / Motor Speech:    Pt reports L labial movement is decreased with reflexive smile, but WNL when smile is upon request.  Confirmed in tx with observation.     Cognitive Linguistic:    Checkbook:  Initial trials pt is accurate with calculations, but items are not lined up accurately and there is one number that is part of another number.  Pt does not use it; it is extraneous and in error.  With review and discussion of errors pt completes task with 100% accuracy, excpet pt left dates off for all items.  This is not addressed with pt at this time.    Time calculations:  3/3 in timely manner.  D/c'd due to ease.    Alternating attention task with calculations:  Pt requires min-mod cues.  Errors are related to pt not changing calculation processes as they are written, loses his place x1 (when he is alerted he is able to locate and fix errors).          Patient/Family/Caregiver Education:    Pt is tearful and reports he is "scared" because these tasks should be easy for him.  D/W pt, wife and mother regarding outpt tx.  Phone number given so they are assured to have the contact per their concerns.     Impressions:      Pt with high level cognitive deficits in the areas of organization and alternating attention.  Pt is tearful regarding these deficits and is counseled on oupt therapy and the cognitive effects of R CVA  Pt will require outpt tx after discharge to facilitate his successful return to work  and the high demands of his life.   Pt's deficits are considered minimal, but their impact is moderate-severe due to the high level demands of the pt's home and work life.    Discharge recommendations:  Outpt speech therapy for high level cognitive communication deficits.    Plan:  Continued daily Speech/Language treatment with goals per  plan of care.    Goal 1: Patient will improve numeric reasoning to independent.: ongoing    Goal 2: Patient will tolerate ongoing evaluation with addition goals to be determined as appropriate. Ongoing. See above.    This note is to serve as a discharge report should the pt be discharged prior to next Speech Therapy session.      Lauralyn Primes, MS CCC/SLP [phone]  Speech Language Pathologist  07/31/18  2:53 PM

## 2018-07-31 NOTE — Plan of Care (Signed)
Problem: HEMODYNAMIC STATUS  Goal: Patient has stable vital signs and fluid balance  Outcome: Met This Shift     Problem: ACTIVITY INTOLERANCE/IMPAIRED MOBILITY  Goal: Mobility/activity is maintained at optimum level for patient  Outcome: Met This Shift     Problem: COMMUNICATION IMPAIRMENT  Goal: Ability to express needs and understand communication  Outcome: Met This Shift     Problem: PAIN  Goal: Patient's pain/discomfort is manageable  Outcome: Met This Shift

## 2018-07-31 NOTE — Consults (Signed)
Cardiology Consultation   Referring Physician: Dr. Jonna Clark  Reason for Consultation: CVA   Chief Complaint:   Chief Complaint   Patient presents with   ??? Shaking     Started this morning around 845, pt was cooking breakfast, has since slowed down a little bit         Subjective:   History of Present Illness:     Peter Rhodes is a 38 y.o. male who presents with the above chief complaint. He admits to sudden onset of slurred speech. Associated with dysarthria. No h/o similar complaints. No alleviating or exacerbating factors. No significant PMHx. Cardiology is consulted for TEE     Prior cardiac testing:  None     Past Medical History:   has no past medical history on file.    Surgical History:   has no past surgical history on file.     Social History:   reports that he has been smoking. He has been smoking about 0.25 packs per day. He has never used smokeless tobacco. He reports current alcohol use. He reports that he does not use drugs.     Family History:  family history is not on file.    Home Medications:  Were reviewed and are listed in nursing record and/or below  Prior to Admission medications    Medication Sig Start Date End Date Taking? Authorizing Provider   vitamin C (ASCORBIC ACID) 500 MG tablet Take 500 mg by mouth daily   Yes Historical Provider, MD          Allergies:  Patient has no known allergies.     Review of Systems:   ?? Constitutional: no unanticipated weight loss. There's been no change in energy level, sleep pattern, or activity level. No fevers, chills.   ?? Eyes: No visual changes or diplopia. No scleral icterus.  ?? ENT: No Headaches, hearing loss or vertigo. No mouth sores or sore throat.  ?? Cardiovascular: as reviewed in HPI  ?? Respiratory: No cough or wheezing, no sputum production. No hemoptysis.    ?? Gastrointestinal: No abdominal pain, appetite loss, blood in stools. No change in bowel or bladder habits.  ?? Genitourinary: No dysuria, trouble voiding, or  hematuria.  ?? Musculoskeletal:  No gait disturbance, no joint complaints.  ?? Integumentary: No rash or pruritis.  ?? Neurological: No headache, diplopia, change in muscle strength, numbness or tingling.   ?? Psychiatric: No anxiety or depression.  ?? Endocrine: No temperature intolerance. No excessive thirst, fluid intake, or urination. No tremor.  ?? Hematologic/Lymphatic: No abnormal bruising or bleeding, blood clots or swollen lymph nodes.  ?? Allergic/Immunologic: No nasal congestion or hives.    Objective:   PHYSICAL EXAM:    Vitals:    07/31/18 1453   BP: 120/82   Pulse: 90   Resp: 16   Temp: 97.9 ??F (36.6 ??C)   SpO2: 96%    Weight: 182 lb 1.6 oz (82.6 kg)       General Appearance:  Alert, cooperative, no distress, appears stated age.   Head:  Normocephalic, without obvious abnormality, atraumatic.   Eyes:  Pupils equal and round. No scleral icterus.   Mouth: Moist mucosa, no pharyngeal erythema.   Nose: Nares normal. No drainage or sinus tenderness.   Neck: Supple, symmetrical, trachea midline. No adenopathy. No tenderness/mass/nodules. No carotid bruit or elevated JVD.   Lungs:   Clear to auscultation bilaterally, respirations unlabored. No wheeze, rales, or rhonchi.   Chest Wall:  No  tenderness or deformity.   Heart:  Regular rate. S1/S2 normal. No murmur, rub, or gallop.   Abdomen:   Soft, non-tender, bowel sounds active.   Musculoskeletal: No muscle wasting or digital clubbing.   Extremities: Extremities normal, atraumatic. No cyanosis or edema.   Pulses: 2+ radial and carotid pulses, symmetric.   Skin: No rashes or lesions.   Pysch: Normal mood and affect. Alert and oriented x 4.   Neurologic: Normal gross motor and sensory exam.       Labs     CBC:   Lab Results   Component Value Date    WBC 7.4 07/30/2018    RBC 4.89 07/30/2018    HGB 14.7 07/30/2018    HCT 43.0 07/30/2018    MCV 87.8 07/30/2018    RDW 13.1 07/30/2018    PLT 334 07/30/2018     CMP:  Lab Results   Component Value Date    NA 139 07/29/2018     K 4.3 07/29/2018    CL 101 07/29/2018    CO2 27 07/29/2018    BUN 22 07/29/2018    CREATININE 0.8 07/29/2018    GFRAA >60 07/29/2018    AGRATIO 1.8 07/29/2018    LABGLOM >60 07/29/2018    GLUCOSE 96 07/29/2018    PROT 6.7 07/29/2018    CALCIUM 9.5 07/29/2018    BILITOT 0.5 07/29/2018    ALKPHOS 54 07/29/2018    AST 20 07/29/2018    ALT 22 07/29/2018     PT/INR:  No results found for: PTINR  HgBA1c:  Lab Results   Component Value Date    LABA1C 5.1 07/30/2018     Lab Results   Component Value Date    TROPONINI <0.01 07/29/2018         CURRENT Medications:  Current Facility-Administered Medications: sodium chloride flush 0.9 % injection 10 mL, 10 mL, Intravenous, 2 times per day  sodium chloride flush 0.9 % injection 10 mL, 10 mL, Intravenous, PRN  polyethylene glycol (GLYCOLAX) packet 17 g, 17 g, Oral, Daily PRN  promethazine (PHENERGAN) tablet 12.5 mg, 12.5 mg, Oral, Q6H PRN  ondansetron (ZOFRAN) injection 4 mg, 4 mg, Intravenous, Q6H PRN  enoxaparin (LOVENOX) injection 40 mg, 40 mg, Subcutaneous, Daily  aspirin EC tablet 81 mg, 81 mg, Oral, Daily **OR** aspirin suppository 300 mg, 300 mg, Rectal, Daily  atorvastatin (LIPITOR) tablet 80 mg, 80 mg, Oral, Nightly  acetaminophen (TYLENOL) tablet 650 mg, 650 mg, Oral, Q4H PRN  ketorolac (TORADOL) injection 30 mg, 30 mg, Intravenous, Q6H PRN  perflutren lipid microspheres (DEFINITY) injection 1.65 mg, 1.5 mL, Intravenous, ONCE PRN     Cardiac testing     EKG: NSR, RBBB     Echo:     Summary   Normal left ventricle size, wall thickness, and low normal systolic function   with an estimated ejection fraction of 50-55%. No regional wall motion   abnormalities are seen. Normal diastolic function.   The right ventricle is normal in size and function.   No significant valve abnormalities noted.   A bubble study was performed and fails to show evidence of shunting with and   without provocative maneuvers.    Stress Test:     Cath:     All above diagnostic testing was  independently visualized and reviewed by me (not simply review of report)     Patient Active Problem List   Diagnosis   ??? Slurred speech   ??? Acute CVA (cerebrovascular accident) (HCC)  Assessment and Plan   1) Acute CVA  - NPO for TEE tomorrow   - recommend 30 days event monitor   - asa/statin     2) tobacco abuse   - discussed cessation at length       Thank you for allowing [place] to participate in the care of Lavona M[place].    Caprice Beaver, MD [facility]  General and Interventional Cardiology   [facility]   (C): [phone]  [place]): [phone]     MDM  Number of Diagnoses or Management Options  Dysarthria: new, needed workup  Facial droop: new, needed workup     Amount and/or Complexity of Data Reviewed  Clinical lab tests: reviewed  Tests in the radiology section of CPT??: reviewed  Tests in the medicine section of CPT??: reviewed  Review and summarize past medical records: yes  Independent visualization of images, tracings, or specimens: yes    Risk of Complications, Morbidity, and/or Mortality  Presenting problems: moderate  Diagnostic procedures: moderate  Management options: moderate

## 2018-07-31 NOTE — Progress Notes (Signed)
Hospitalist Progress Note      PCP: No primary care provider on file.    Date of Admission: 07/29/2018    Chief Complaint: slurred speech, Left facial droop, headache.       Subjective:     Feels well today. Headache improved.      MRI revealed large R frontal CVA.        Medications:  Reviewed    Infusion Medications   Scheduled Medications   ??? sodium chloride flush  10 mL Intravenous 2 times per day   ??? enoxaparin  40 mg Subcutaneous Daily   ??? aspirin  81 mg Oral Daily    Or   ??? aspirin  300 mg Rectal Daily   ??? atorvastatin  80 mg Oral Nightly     PRN Meds: sodium chloride flush, polyethylene glycol, promethazine, ondansetron, acetaminophen, ketorolac, perflutren lipid microspheres      Intake/Output Summary (Last 24 hours) at 07/31/2018 1322  Last data filed at 07/30/2018 2100  Gross per 24 hour   Intake 610 ml   Output --   Net 610 ml       Physical Exam Performed:    BP 127/82    Pulse 85    Temp 98 ??F (36.7 ??C) (Oral)    Resp 18    Ht 6\' 2"  (1.88 m)    Wt 182 lb 1.6 oz (82.6 kg)    SpO2 97%    BMI 23.38 kg/m??     General appearance: No apparent distress, appears stated age and cooperative.  HEENT: Pupils equal, round, and reactive to light. Conjunctivae/corneas clear.  Neck: Supple, with full range of motion. No jugular venous distention. Trachea midline.  Respiratory:  Normal respiratory effort. Clear to auscultation, bilaterally without Rales/Wheezes/Rhonchi.  Cardiovascular: Regular rate and rhythm with normal S1/S2 without murmurs, rubs or gallops.  Abdomen: Soft, non-tender, non-distended with normal bowel sounds.  Musculoskeletal: No clubbing, cyanosis or edema bilaterally.  Full range of motion without deformity.  Skin: Skin color, texture, turgor normal.  No rashes or lesions.  Neurologic:  Neurovascularly intact without any focal sensory/motor deficits. Cranial nerves: II-XII intact, grossly non-focal.  Psychiatric: Alert and oriented, thought content appropriate, normal insight  Capillary Refill:  Brisk,< 3 seconds   Peripheral Pulses: +2 palpable, equal bilaterally       Labs:   Recent Labs     07/29/18  1030 07/30/18  0702   WBC 5.1 7.4   HGB 14.3 14.7   HCT 41.5 43.0   PLT 337 334     Recent Labs     07/29/18  1029   NA 139   K 4.3   CL 101   CO2 27   BUN 22*   CREATININE 0.8*   CALCIUM 9.5     Recent Labs     07/29/18  1029   AST 20   ALT 22   BILITOT 0.5   ALKPHOS 54     Recent Labs     07/30/18  1429   INR 0.97     Recent Labs     07/29/18  1029   TROPONINI <0.01       Urinalysis:    No results found for: NITRU, WBCUA, BACTERIA, RBCUA, BLOODU, SPECGRAV, GLUCOSEU    Radiology:  MRI brain without contrast   Final Result   Acute right frontal lobe infarct, extending into the frontal operculum.      The findings were sent to the Radiology Results Communication Center  at 7:33   pm on 2/23/2020to be communicated to a licensed caregiver.         CT Head WO Contrast   Final Result   No acute intracranial abnormality.         CTA HEAD NECK W CONTRAST   Final Result   Unremarkable CTA of the head and neck.         XR CHEST PORTABLE   Final Result   Unremarkable.                 Assessment/Plan:    Active Hospital Problems    Diagnosis   ??? Acute CVA (cerebrovascular accident) (HCC) [I63.9]   ??? Slurred speech [R47.81]       Acute Ischemic CVA  No prior hx   Smokes, otherwise no med problems, not on any daily meds prior to admission.   Plan:    - discussed with neuro.    - cont ASA and statin.    - ECHO TTE done and pending.    - needs TEE - discussed with cardiology - will try and get on schedule today - keep NPO.    - cont telemetry monitor.    - CVA protocol    - hypercoag sent per neurology        Headache - due to above - improved.       HLD - target LDL<70  Started statin this admit.            DVT Prophylaxis: lovenox  Diet: Diet NPO Effective Now  Code Status: Full Code      Dispo - inpatient.    Bernette Mayers, MD

## 2018-08-01 ENCOUNTER — Encounter: Primary: Internal Medicine

## 2018-08-01 ENCOUNTER — Inpatient Hospital Stay: Payer: PRIVATE HEALTH INSURANCE | Primary: Internal Medicine

## 2018-08-01 LAB — ECHOCARDIOGRAM TRANSESOPHAGEAL: Left Ventricular Ejection Fraction: 50

## 2018-08-01 LAB — BETA-2 GLYCOPROTEIN ANTIBODIES
Beta-2 Glyco 1 IgG: 0 SGU (ref 0–20)
Beta-2 Glyco 1 IgM: 3 SMU (ref 0–20)

## 2018-08-01 MED ORDER — SODIUM CHLORIDE 0.9 % IV SOLN
0.9 | INTRAVENOUS | Status: AC
Start: 2018-08-01 — End: 2018-08-01

## 2018-08-01 MED ORDER — ATORVASTATIN CALCIUM 80 MG PO TABS
80 MG | ORAL_TABLET | Freq: Every evening | ORAL | 3 refills | Status: AC
Start: 2018-08-01 — End: ?

## 2018-08-01 MED ORDER — FENTANYL CITRATE (PF) 100 MCG/2ML IJ SOLN
100 | INTRAMUSCULAR | Status: AC
Start: 2018-08-01 — End: 2018-08-01

## 2018-08-01 MED ORDER — BENZOCAINE 20 % MT SOLN
20 | OROMUCOSAL | Status: AC
Start: 2018-08-01 — End: 2018-08-01

## 2018-08-01 MED ORDER — LIDOCAINE VISCOUS HCL 2 % MT SOLN
2 | OROMUCOSAL | Status: AC
Start: 2018-08-01 — End: 2018-08-01

## 2018-08-01 MED ORDER — MIDAZOLAM HCL 2 MG/2ML IJ SOLN
2 | INTRAMUSCULAR | Status: AC
Start: 2018-08-01 — End: 2018-08-01

## 2018-08-01 MED ORDER — ASPIRIN 81 MG PO TBEC
81 MG | ORAL_TABLET | Freq: Every day | ORAL | 3 refills | Status: AC
Start: 2018-08-01 — End: ?

## 2018-08-01 MED ORDER — CLOPIDOGREL BISULFATE 75 MG PO TABS
75 MG | ORAL_TABLET | Freq: Every day | ORAL | 0 refills | Status: AC
Start: 2018-08-01 — End: ?

## 2018-08-01 MED FILL — HURRICAINE ONE 20 % MT SOLN: 20 % | OROMUCOSAL | Qty: 1

## 2018-08-01 MED FILL — ASPIRIN LOW DOSE 81 MG PO TBEC: 81 MG | ORAL | Qty: 1

## 2018-08-01 MED FILL — SODIUM CHLORIDE 0.9 % IV SOLN: 0.9 % | INTRAVENOUS | Qty: 500

## 2018-08-01 MED FILL — ACETAMINOPHEN 325 MG PO TABS: 325 mg | ORAL | Qty: 2

## 2018-08-01 MED FILL — FENTANYL CITRATE (PF) 100 MCG/2ML IJ SOLN: 100 MCG/2ML | INTRAMUSCULAR | Qty: 4

## 2018-08-01 MED FILL — ATORVASTATIN CALCIUM 80 MG PO TABS: 80 MG | ORAL | Qty: 1

## 2018-08-01 MED FILL — LIDOCAINE VISCOUS HCL 2 % MT SOLN: 2 % | OROMUCOSAL | Qty: 15

## 2018-08-01 MED FILL — MIDAZOLAM HCL 2 MG/2ML IJ SOLN: 2 mg/mL | INTRAMUSCULAR | Qty: 6

## 2018-08-01 NOTE — Discharge Summary (Signed)
Hospital Medicine Discharge Summary    Patient ID: Peter Rhodes      Patient's PCP: No primary care provider on file.    Admit Date: 07/29/2018     Discharge Date:   08/01/2018    Admitting Physician: Brent General, DO     Discharge Physician: Bernette Mayers, MD     Discharge Diagnoses:       Active Hospital Problems    Diagnosis   ??? Acute CVA (cerebrovascular accident) (HCC) [I63.9]   ??? Slurred speech [R47.81]       The patient was seen and examined on day of discharge and this discharge summary is in conjunction with any daily progress note from day of discharge.    Hospital Course: 38yo man with Hx of tobacco use, otherwise without chronic medical problems, presented with slurred speech, left facial droop, headache.           Acute Ischemic CVA  No prior hx   Smokes, otherwise no med problems, not on any daily meds prior to admission.   Suspect embolic etiology.   Plan:               - discussed with neuro.               - cont ASA and statin.    - will use plavix first 30 days.               - ECHO TTE normal   - ECHO TEE - discussed with Dr Lynne Logan - no acute findings.    - plan for event monitor - to be set up prior to discharge.    - hypercoag sent per neurology - will follow with CVA specialist.   ??  ??  Headache - due to above - improved.   ??  ??  HLD - target LDL<70  Started statin this admit - continued.     ??    Ongoing dysarthria - will cont with OP SLP therapy - ordered.        Physical Exam Performed:     BP 109/73    Pulse 86    Temp 97.8 ??F (36.6 ??C) (Oral)    Resp 16    Ht 6\' 2"  (1.88 m)    Wt 181 lb 10.5 oz (82.4 kg)    SpO2 95%    BMI 23.32 kg/m??       General appearance:  No apparent distress, appears stated age and cooperative.  HEENT:  Normal cephalic, atraumatic without obvious deformity. Pupils equal, round, and reactive to light.  Extra ocular muscles intact. Conjunctivae/corneas clear.  Neck: Supple, with full range of motion. No jugular venous distention. Trachea  midline.  Respiratory:  Normal respiratory effort. Clear to auscultation, bilaterally without Rales/Wheezes/Rhonchi.  Cardiovascular:  Regular rate and rhythm with normal S1/S2 without murmurs, rubs or gallops.  Abdomen: Soft, non-tender, non-distended with normal bowel sounds.  Musculoskeletal:  No clubbing, cyanosis or edema bilaterally.  Full range of motion without deformity.  Skin: Skin color, texture, turgor normal.  No rashes or lesions.  Neurologic:  Neurovascularly intact without any focal sensory/motor deficits. Cranial nerves: II-XII intact, grossly non-focal.  Psychiatric:  Alert and oriented, thought content appropriate, normal insight  Capillary Refill: Brisk,< 3 seconds   Peripheral Pulses: +2 palpable, equal bilaterally       Labs: For convenience and continuity at follow-up the following most recent labs are provided:      CBC:  Lab Results   Component Value Date    WBC 7.4 07/30/2018    HGB 14.7 07/30/2018    HCT 43.0 07/30/2018    PLT 334 07/30/2018       Renal:    Lab Results   Component Value Date    NA 139 07/29/2018    K 4.3 07/29/2018    CL 101 07/29/2018    CO2 27 07/29/2018    BUN 22 07/29/2018    CREATININE 0.8 07/29/2018    CALCIUM 9.5 07/29/2018         Significant Diagnostic Studies    Radiology:   MRI brain without contrast   Final Result   Acute right frontal lobe infarct, extending into the frontal operculum.      The findings were sent to the Radiology Results Communication Center at 7:33   pm on 2/23/2020to be communicated to a licensed caregiver.         CT Head WO Contrast   Final Result   No acute intracranial abnormality.         CTA HEAD NECK W CONTRAST   Final Result   Unremarkable CTA of the head and neck.         XR CHEST PORTABLE   Final Result   Unremarkable.                Consults:     IP CONSULT TO NEUROLOGY    Disposition:  home     Condition at Discharge: Stable    Discharge Instructions/Follow-up:  Neurology 1 week. Cardiology - as arranged for event monitor  interrogation.     Code Status:  Full Code     Activity: activity as tolerated    Diet: regular diet      Discharge Medications:     Current Discharge Medication List           Details   aspirin 81 MG EC tablet Take 1 tablet by mouth daily  Qty: 30 tablet, Refills: 3      atorvastatin (LIPITOR) 80 MG tablet Take 1 tablet by mouth nightly  Qty: 30 tablet, Refills: 3      clopidogrel (PLAVIX) 75 MG tablet Take 1 tablet by mouth daily  Qty: 30 tablet, Refills: 0              Details   vitamin C (ASCORBIC ACID) 500 MG tablet Take 500 mg by mouth daily             Time Spent on discharge is more than 30 minutes in the examination, evaluation, counseling and review of medications and discharge plan.      Signed:    Bernette Mayers, MD   08/01/2018      Thank you No primary care provider on file. for the opportunity to be involved in this patient's care. If you have any questions or concerns please feel free to contact me at [phone].

## 2018-08-01 NOTE — Discharge Instructions (Signed)
Continuity of Care Form    Patient Name: Peter Rhodes   DOB:  05/12/1981  MRN:  [id number]    Admit date:  07/29/2018  Discharge date:  ***    Code Status Order: Full Code   Advance Directives:   Advance Care Flowsheet Documentation     Date/Time Healthcare Directive Type of Healthcare Directive Copy in Chart Healthcare Agent Appointed Healthcare Agent's Name Healthcare Agent's Phone Number    07/29/18 1739  No, patient does not have an advance directive for healthcare treatment -- -- -- -- --          Admitting Physician:  Brent General, DO  PCP: No primary care provider on file.    Discharging Nurse: [facility] Unit/Room#: K5N-5263/5263-01  Discharging Unit Phone Number: ***    Emergency Contact:   Extended Emergency Contact Information  Primary Emergency Contact: Mahn,STACY  Address: [address]           [place], [place] [address]  Home Phone: [phone]  Work Phone: [phone]  Relation: Spouse  Secondary Emergency Contact: Escue,RONALD  Address: [address]           [place], [place] [address]  Home Phone: [phone]  Work Phone: [phone]  Relation: Parent    Past Surgical History:  History reviewed. No pertinent surgical history.    Immunization History:     There is no immunization history on file for this patient.    Active Problems:  Patient Active Problem List   Diagnosis Code   ??? Slurred speech R47.81   ??? Acute CVA (cerebrovascular accident) (HCC) I63.9       Isolation/Infection:   Isolation          No Isolation        Patient Infection Status     None to display          Nurse Assessment:  Last Vital Signs: BP 109/73    Pulse 86    Temp 97.8 ??F (36.6 ??C) (Oral)    Resp 16    Ht 6\' 2"  (1.88 m)    Wt 181 lb 10.5 oz (82.4 kg)    SpO2 95%    BMI 23.32 kg/m??     Last documented pain score (0-10 scale): Pain Level: 2  Last Weight:   Wt Readings from Last 1 Encounters:   08/01/18 181 lb 10.5 oz (82.4 kg)     Mental Status:  {IP PT MENTAL STATUS:20030}    IV Access:  {MH COC IV  ACCESS:304088262}    Nursing Mobility/ADLs:  Walking   {CHP DME NIDP:824235361}  Transfer  {CHP DME WERX:540086761}  Bathing  {CHP DME PJKD:326712458}  Dressing  {CHP DME KDXI:338250539}  Toileting  {CHP DME JQBH:419379024}  Feeding  {CHP DME OXBD:532992426}  Med Admin  {CHP DME STMH:962229798}  Med Delivery   {MH COC MED Delivery:304088264}    Wound Care Documentation and Therapy:        Elimination:  Continence:   ?? Bowel: {YES / XQ:11941}  ?? Bladder: {YES / DE:08144}  Urinary Catheter: {Urinary Catheter:304088013}   Colostomy/Ileostomy/Ileal Conduit: {YES / YJ:85631}       Date of Last BM: ***    Intake/Output Summary (Last 24 hours) at 08/01/2018 1136  Last data filed at 07/31/2018 2027  Gross per 24 hour   Intake 610 ml   Output --   Net 610 ml     I/O last 3 completed shifts:  In: 610 [P.O.:600; I.V.:10]  Out: -     Safety Concerns:     {MH COC Safety Concerns:304088272}    Impairments/Disabilities:      {MH COC Impairments/Disabilities:304088273}    Nutrition Therapy:  Current Nutrition Therapy:   {MH COC Diet List:304088271}    Routes of Feeding: {CHP DME Other Feedings:304088042}  Liquids: {Slp liquid thickness:30034}  Daily Fluid Restriction: {CHP DME Yes amt example:304088041}  Last Modified Barium Swallow with Video (Video Swallowing Test): {Done Not Done GYJE:563149702}    Treatments at the Time of Hospital Discharge:   Respiratory Treatments: ***  Oxygen Therapy:  {Therapy; copd oxygen:17808}  Ventilator:    {MH CC Vent OVZC:588502774}    Rehab Therapies: {THERAPEUTIC INTERVENTION:669-703-8194}  Weight Bearing Status/Restrictions: {MH CC Weight Bearing:304508812}  Other Medical Equipment (for information only, NOT a DME order):  {EQUIPMENT:304520077}  Other Treatments: ***    Patient's personal belongings (please select all that are sent with patient):  {CHP DME Belongings:304088044}    RN SIGNATURE:  {Esignature:304088025}    CASE MANAGEMENT/SOCIAL WORK SECTION    Inpatient Status Date: ***    Readmission  Risk Assessment Score:  Readmission Risk              Risk of Unplanned Readmission:        6           Discharging to Facility/ Agency   ?? Name:   ?? Address:  ?? Phone:  ?? Fax:    Dialysis Facility (if applicable)   ?? Name:  ?? Address:  ?? Dialysis Schedule:  ?? Phone:  ?? Fax:    Case Manager/Social Worker signature: {Esignature:304088025}    PHYSICIAN SECTION    Prognosis: {Prognosis:804-359-3185}    Condition at Discharge: {MH Patient Condition:304088024}    Rehab Potential (if transferring to Rehab): {Prognosis:804-359-3185}    Recommended Labs or Other Treatments After Discharge: ***    Physician Certification: I certify the above information and transfer of Peter Rhodes  is necessary for the continuing treatment of the diagnosis listed and that he requires {Admit to Appropriate Level of Care:20763} for {GREATER/LESS:304500278} 30 days.     Update Admission H&P: {CHP DME Changes in JOINO:676720947}    PHYSICIAN SIGNATURE:  {Esignature:304088025}

## 2018-08-01 NOTE — Discharge Instructions (Signed)
Disposition:  home     Condition at Discharge: Stable    Discharge Instructions/Follow-up:  Neurology 1 week. Cardiology - as arranged for event monitor interrogation.      Activity: activity as tolerated    Diet: regular diet

## 2018-08-01 NOTE — Progress Notes (Signed)
Speech Language/Pathology   DAILY TREATMENT NOTE  Speech Therapy Department     Lavona Mound  AGE: 38 y.o.   GENDER: male  DOB: 12-Aug-1980  [id number]  EPISODE DATE:  07/29/2018    Dysphagia Treatment Diagnosis: Oropharyngeal Dysphagia   Speech Language Treatment Diagnosis: Cognitive-Language      Chief Complaint   Patient presents with   ??? Shaking     Started this morning around 845, pt was cooking breakfast, has since slowed down a little bit     PAST MEDICAL HISTORY  History reviewed. No pertinent past medical history.    PAST SURGICAL HISTORY  History reviewed. No pertinent surgical history.    ALLERGIES  No Known Allergies    CHART REVIEW:  07/29/2018 admitted with CVA: The patient is a 38 y.o. male who presents to [facility] with an acute onset of a rt side posterior headache followed about 10 minutes later by slurred speech and a left side facial droop. His wife called 911. He had no other focal weakness or paresthesias. In the ed labs were normal, vitals stable. Ct head was nonacute. He has never had anything like this before. He has no h/o migraines. His father had a cabg in his 26s. Grandmother and ischemic cva in her 41s. When I saw him his speech had improved bu he had a decreased smile on the left.  07/29/2018 Brain MRI: Acute right frontal lobe infarct, extending into the frontal operculum    SUBJECTIVE  Chart Reviewed: Yes  Family / Caregiver Present: Yes(wife [name])  Subjective: Accepted and tolerated TX at bedside  Patient Currently in Pain: No  Social/Functional History  ?? Lives With: (spouse, 39 year-old daughter)  ?? Homemaking Assistance: Independent  ?? Active Driver: Yes  ?? Education: graduated high school  ?? Occupation: Full time employment  ?? 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 Leisure & Hobbies: sports (going to games)  ?? Vision: Within Functional Limits  ?? Hearing: Within functional limits    OBJECTIVE  Cognitive-Language  Overall Orientation Status:  Within Functional Limits  Attention: mild imp for alternating/divided attention  Memory: mild imp for delayed recall with impact on working memory during complex tasks  Problem Solving: mild imp for abstract logic/reasoning  Numeric Reasoning: requires extra time for calculations which is well below baseline level of function    ASSESSMENT  ?? Cognitive Diagnosis: Mild cognitive-language impairments characterized by the need for extra time for calculations and mildly decreased high-level executive function skills which is well patient's baseline level of function.    PLAN  Requires SLP Intervention: Yes  Duration/Frequency of Treatment: ST to tx 3-5 tiems per week for cognitive-language during acute admission  Speech and Language Goals  Goal 1: Patient will improve numeric reasoning to independent. CONTINUE  Goal 2: Patient will tolerate ongoing evaluation with addition goals to be determined as appropriate. CONTINUE    Prognosis  Good for ST    Education  Consulted and agree with results and recommendations: Patient, Family member, RN  Patient Education: Completed on results/recs/plan  Patient Education Response: Verbalizes understanding    Discharge Recommendations:    D/C Recommendations: (skilled ST recommended upon discharge)    Please accept this as Speech Therapy Discharge status, if pt is discharged prior to next therapy session.      Timed Code Treatment: 15  Total Treatment Time: 25      Norval Slaven L. [name], MA, [place], #[id number]  Speech-Language Pathologist  08/01/2018 10:30 AM

## 2018-08-01 NOTE — Plan of Care (Signed)
Problem: HEMODYNAMIC STATUS  Goal: Patient has stable vital signs and fluid balance  08/01/2018 0025 by Angelica Ran, RN  Outcome: Ongoing     Problem: COMMUNICATION IMPAIRMENT  Goal: Ability to express needs and understand communication  08/01/2018 0025 by Angelica Ran, RN  Outcome: Ongoing     Problem: SAFETY  Goal: Free from accidental physical injury  Outcome: Ongoing     Problem: DAILY CARE  Goal: Daily care needs are met  Outcome: Ongoing     Problem: PAIN  Goal: Patient's pain/discomfort is manageable  08/01/2018 0025 by Angelica Ran, RN  Outcome: Ongoing     Problem: SKIN INTEGRITY  Goal: Skin integrity is maintained or improved  Outcome: Ongoing     Problem: KNOWLEDGE DEFICIT  Goal: Patient/S.O. demonstrates understanding of disease process, treatment plan, medications, and discharge instructions.  Outcome: Ongoing     Problem: Falls - Risk of:  Goal: Will remain free from falls  Description  Will remain free from falls  Outcome: Ongoing

## 2018-08-01 NOTE — Telephone Encounter (Signed)
Received order from Clydie Braun, [place] for event monitor.  This MA enrolled pt through [employer].  This MA will take monitor to pt and review all instructions once pt returns from TEE.

## 2018-08-01 NOTE — Progress Notes (Signed)
Data- discharge order received, pt verbalized agreement to discharge, disposition to previous residence, no needs for HHC/DME.     Action- discharge instructions prepared and given to patient, pt verbalized understanding. Medication information packet given r/t NEW and/or CHANGED prescriptions emphasizing name/purpose/side effects, pt verbalized understanding. Discharge instruction summary: Diet- general, Activity- as tol, Primary Care Physician as follows: No primary care provider on file. None f/u appointment with neurology and cardiology, immunizations reviewed, scripts for prescription medications given.     Response- Pt belongings gathered, IV removed. Disposition is home (no HHC/DME needs), transported with belongings, patient left unit by foot with family for discharge home, no complications.

## 2018-08-01 NOTE — Progress Notes (Signed)
Speech Language Pathology    Treatment attempted.  Patient unavailable out of room at TEE.  Family present: education completed with family on need/rec for OP ST as well as therapeutic home activities.  ST to re-attempt as schedule permits.    Please provide patient with script for OP ST eval/tx upon discharge home.    Thank you.  Morgan Keinath L. [name], MA, [place], #[id number]  Speech-Language Pathologist

## 2018-08-02 LAB — FACTOR 5 LEIDEN: Factor V Leiden: NEGATIVE

## 2018-08-02 LAB — CARDIOLIPIN ANTIBODIES IGG & IGM
Anticardiolipin IgG: 2 [GPL'U] (ref 0–14)
Cardiolipin Ab IgM: 8 [MPL'U] (ref 0–12)

## 2018-08-03 LAB — PROTHROMBIN GENE MUTATION: Prothrombin Mutation: NEGATIVE

## 2018-08-03 LAB — SPECIMEN REJECTION: Rejected Test: 4

## 2018-08-06 ENCOUNTER — Inpatient Hospital Stay: Admit: 2018-08-06 | Discharge: 2018-08-06 | Payer: PRIVATE HEALTH INSURANCE | Primary: Internal Medicine

## 2018-08-06 DIAGNOSIS — F09 Unspecified mental disorder due to known physiological condition: Secondary | ICD-10-CM

## 2018-08-06 NOTE — Progress Notes (Incomplete)
Outpatient Speech Therapy  []  [facility]   Phone: [phone]   Fax: [phone]   [x]  [facility]  Phone: [phone]              Fax: [phone]  []  [name]   Phone: [phone]   Fax: [phone]     To:        Patient: Peter Rhodes  DOB: 11-16-1980  MRN: [id number]  Evaluation Date: 08/06/2018      Diagnosis Information:            Speech Therapy Certification/Re-Certification Form  Dear Dr.   Donn Pierini following patient has been evaluated for speech therapy services and for therapy to continue, Medicare requires monthly physician review of the treatment plan. Please review the attached evaluation and/or summary of the patient's plan of care, and verify that you agree therapy should continue by signing the attached document and sending it back to our office.    Plan of Care/Treatment to date:  []  Speech-Language Evaluation/Treatment    []  Dysphagia Evaluation/Treatment        []  Dysphagia Treatment via Neuromuscular Electrical Stimulation (NMES)   []  Modified Barium Swallowing Study   []  Cognitive-Linguistic Skills Development  []  Voice evaluation and Treatment      []  Evaluation, modification, and Training of Voice Prosthetic     []  Evaluation for Speech-Generating Augmentative and Alternative Communication Device   []  Therapeutic Services for the use of Speech-Generating Device.   []  Other:          Frequency/Duration:  # Days per week: []  1 day # Weeks: []  1 week []  5 weeks      []  2 days?   []  2 weeks []  6 weeks     []  3 days   []  3 weeks []  7 weeks     []  4 days   []  4 weeks []  8 weeks    Rehab Potential: []  Excellent []  Good []  Fair  []  Poor       Electronically signed by:  Human resources officer, MA,CCC-SLP      If you have any questions or concerns, please don't hesitate to call.  Thank you for your referral.      Physician Signature:________________________________Date:__________________  By signing above, therapist???s plan is approved by physician

## 2018-08-06 NOTE — Progress Notes (Signed)
Speech Language Pathology  Facility/Department: [facility] THERAPY  Initial Assessment    NAME: Peter Rhodes  DOB: [birth year]  MRN: [id number]    Date of Eval: 08/06/2018  Evaluating Therapist: Marland Kitchen    Visit Diagnoses       Codes    Language-related cognitive disorder    -  Primary F09          Primary Complaint:   Comments: Patient c/o difficulty with analog time-reading and multi-tasking  Onset Date: 07/29/18    Pain:  Pain:  Denies    Assessment:  Mild cognitive-language impairments characterized by mildly impaired divided and alternating attention and decreased abstract problem solving/reasoning for completion of multi-step complex calculation, multi-step/unit complex problems, and decreased mental flexibility for executive function tasks.      Subjective:  Chart Reviewed: Yes  Patient assessed for rehabilitation services?: Yes  Family / Caregiver Present: No  SLP Insurance Information: UHC  Total # of Visits Approved: 20  Subjective: Accepted and tolerated evaluation  Social/Functional History  ?? Lives With: Spouse;Daughter  ?? Homemaking Assistance: Independent  ?? Active Driver: Yes  ?? Mode of Transportation: Car  ?? Education: high school  ?? Occupation: Full time employment  ?? Type of occupation: sales  ?? Leisure & Hobbies: sports (going to games)  ?? Active Driver: Yes  ?? Mode of Transportation: Car  ?? Vision: Within Functional Limits  ?? Hearing: Within functional limits     Objective:  Oral/Motor  Oral Motor: Within functional limits  Auditory Comprehension  Comprehension: Within Functional Limits  Reading Comprehension  Reading Status: Within functional limits  Verbal Expression  Verbal Expression: Within functional limits  Written Expression  Dominant Hand: Right  Written Expression: Within Functional Limits  Motor Speech  Motor Speech: Within Functional Limits  Pragmatics/Social Functioning  Pragmatics: Within functional limits  Cognitive-Language  Overall Orientation Status: Within Functional  Limits  Alternating Attention: Mild  Divided Attention: Mild  Selective Attention: (WFL)  Sustained Attention: [facility])  Memory: Within Functional Limits  Problem Solving: Within Functional Limits  Calculations: Mild  Money Management: Mild  Time: Mild  Abstract Reasoning: Within Functional Limits  Complex Functional Tasks: Mild  Impulsive: Mild  Flexibility of Thought: Mild    Plan:    Requires SLP Intervention: Yes  Duration/Frequency of Treatment: ST to tx 1-2 times per week for cognitive-language for 4-6 weeks  Goal 1: The patient will complete executive function tasks without cues for alternating and/or divided attention.  Goal 2: The patient will complete complex multi-step calculations independently.  Goal 3: The patient will complete executive function tasks independent of cues for problem solving/reasoning.  Patient/family involved in developing goals and treatment plan: YES    Patient Education: Completed on results/recs/plan  Patient Education Response: Verbalizes understanding  Speech Therapy Prognosis: Excellent      [name] L. [address], MA, [place], #[id number]  Speech-Language Pathologist  08/06/2018 2:00 PM

## 2018-08-10 ENCOUNTER — Inpatient Hospital Stay: Admit: 2018-08-10 | Discharge: 2018-08-10 | Payer: PRIVATE HEALTH INSURANCE | Primary: Internal Medicine

## 2018-08-10 NOTE — Progress Notes (Signed)
Speech-Language Pathology  Daily Treatment Note    Date:  08/10/2018    Patient Name:  Peter Rhodes    DOB:  1980-07-31  MRN: [id number]  Restrictions/Precautions:  NA  Diagnosis:  Cognitive-Language Disorder  Treatment Diagnosis:  Cognitive-Language Disorder  Insurance/Certification information:    Referring Physician:    Plan of care signed (Y/N):    Visit# / total visits:  2/20  Pain level: 0/10     Subjective:    Chart Reviewed: Yes  Family / Caregiver Present: No  SLP Insurance Information: [employer]  Total # of Visits Approved: 20  Subjective: Accepted and tolerated treatment  Social/Functional History  ?? Lives With: Spouse;Daughter  ?? Homemaking Assistance: Independent  ?? Active Driver: Yes  ?? Mode of Transportation: Car  ?? Education: high school  ?? Occupation: Full time employment  ?? Type of occupation: sales  ?? Leisure & Hobbies: sports (going to games)  ?? Active Driver: Yes  ?? Mode of Transportation: Car  ?? Vision: Within Functional Limits  ?? Hearing: Within functional limits    Objective:   Cognitive-Language  Alternating Attention: WFL  Divided Attention: [facility]  Calculations: [facility]  Money Management: WFL  Time: WFL  Deductive/Abstract reasoning: WFL  Flexibility of Thought: WFL    Assessment:   Resolving cognitive-language impairments characterized by resolving impaired divided and alternating attention and decreased abstract problem solving/reasoning for completion of multi-step complex calculation, multi-step/unit complex problems, and decreased mental flexibility for executive function tasks.      Plan:   Requires SLP Intervention: Yes  Duration/Frequency of Treatment: ST to tx 1-2 times per week for cognitive-language for 4-6 weeks  Goal 1: The patient will complete executive function tasks without cues for alternating and/or divided attention. Met/monitor for consistent function  Goal 2: The patient will complete complex multi-step calculations independently. Met/monitor for consistent function  Goal  3: The patient will complete executive function tasks independent of cues for problem solving/reasoning. Met/monitor for consistent function  Patient/family involved in developing goals and treatment plan: YES  ??  Patient Education: Completed on results/recs/plan  Patient Education Response: Verbalizes understanding  Speech Therapy Prognosis: Excellent    Timed Code Treatment: 45  Total Treatment Time: 45    Signature:   Tarell Schollmeyer L. [address], [place], [place], [id number]  Speech-Language Pathologist  08/10/18 4:03 PM

## 2018-08-13 ENCOUNTER — Inpatient Hospital Stay: Admit: 2018-08-13 | Discharge: 2018-08-13 | Payer: PRIVATE HEALTH INSURANCE | Primary: Internal Medicine

## 2018-08-13 NOTE — Progress Notes (Signed)
Speech-Language Pathology  Daily Treatment Note    Date:  08/13/2018    Patient Name:  Peter Rhodes    DOB:  [birth year]  MRN: [id number]  Restrictions/Precautions:  NA  Diagnosis:  Cognitive-Language Disorder  Treatment Diagnosis:  Cognitive-Language Disorder  Insurance/Certification information:    Referring Physician:    Plan of care signed (Y/N):    Visit# / total visits:  3/20  Pain level: 0/10     Subjective:    Chart Reviewed: Yes  Family / Caregiver Present: No  SLP Insurance Information: UHC  Total # of Visits Approved: 20  Subjective: Accepted and tolerated treatment  Social/Functional History  ?? Lives With: Spouse;Daughter  ?? Homemaking Assistance: Independent  ?? Active Driver: Yes  ?? Mode of Transportation: Car  ?? Education: high school  ?? Occupation: Full time employment  ?? Type of occupation: sales  ?? Leisure & Hobbies: sports (going to games)  ?? Active Driver: Yes  ?? Mode of Transportation: Car  ?? Vision: Within Functional Limits  ?? Hearing: Within functional limits    Objective:   Cognitive-Language  Alternating Attention: WFL with intermittent need for extra time  Divided Attention: WFL with intermittent need for extra time  Calculations: [facility]  Money Management: WFL  Time: WFL  Deductive/Abstract reasoning: WFL  Flexibility of Thought: WFL    Assessment:   Resolving cognitive-language impairments characterized by resolving impaired divided and alternating attention.  Patient requires extra time intermittently for alternating/divided attention.  Discontinue skilled ST 2/2 max potential achieved for skilled ST; recommend continued HEP as well as consideration for work accommodations for decreased alternating/divided attention.    Plan:   Requires SLP Intervention: Yes  Duration/Frequency of Treatment: ST to tx 1-2 times per week for cognitive-language for 4-6 weeks  Goal 1: The patient will complete executive function tasks without cues for alternating and/or divided attention. Met/  Goal 2: The  patient will complete complex multi-step calculations independently. Met/  Goal 3: The patient will complete executive function tasks independent of cues for problem solving/reasoning. Met  Patient/family involved in developing goals and treatment plan: YES  ??  Patient Education: Completed on results/recs/plan  Patient Education Response: Verbalizes understanding  Speech Therapy Prognosis: Excellent    Timed Code Treatment: 30  Total Treatment Time: 30    Signature:   Jahari Wiginton L. [address] [place], [place], [id number]  Speech-Language Pathologist  08/13/18 3:50 PM

## 2018-08-17 ENCOUNTER — Encounter: Payer: PRIVATE HEALTH INSURANCE | Primary: Internal Medicine

## 2018-08-20 ENCOUNTER — Encounter: Payer: PRIVATE HEALTH INSURANCE | Primary: Internal Medicine

## 2018-08-24 ENCOUNTER — Encounter: Payer: PRIVATE HEALTH INSURANCE | Primary: Internal Medicine

## 2018-08-27 ENCOUNTER — Encounter: Payer: PRIVATE HEALTH INSURANCE | Primary: Internal Medicine

## 2018-08-27 NOTE — Telephone Encounter (Signed)
Please advise, pt wants a phone visit instead of an office visit to see Dr. Lynne Logan 09/04/18.

## 2018-08-27 NOTE — Telephone Encounter (Signed)
Peter Rhodes is calling in asking if he can have a phone appointment on 09/04/18 instead of coming in to our office to see Dr.Raskin?    Notes: [facility] [place]/ Dr. Lynne Logan had heart monitor put on in the hospital, hosp f/u/ also had a stroke on Feb 27th    Can be reached at [phone]

## 2018-08-27 NOTE — Telephone Encounter (Signed)
His appt. should be moved back until after or between 09/13/18-09/27/18 but yes we can do a telephone visit with Dr. Lynne Logan with concerns/precautions for COVID-19 currently in place.   This scheduled follow up 3/31/20is for his 1st hfu (acute CVA, negative TTE w/ bubble study) and to review results of his 30 day Event monitor that he started wearing on 08/02/18. His follow up should have been scheduled out a minimum of 6-8 weeks from 08/02/18, so that once finished wearing it, he can ship it back into the company and then final report generated, sent to [place] for Dr. Ricki Miller review and then call him with results.     Any questions, let me know. Thanks.

## 2018-08-27 NOTE — Telephone Encounter (Signed)
Talked to patient had he has a Phone call office visit on 09/17/18 @ 8:15 am to go over results and hosp f/u

## 2018-08-31 ENCOUNTER — Encounter: Payer: PRIVATE HEALTH INSURANCE | Primary: Internal Medicine

## 2018-09-03 ENCOUNTER — Encounter: Payer: PRIVATE HEALTH INSURANCE | Primary: Internal Medicine

## 2018-09-04 ENCOUNTER — Encounter: Attending: Cardiovascular Disease | Primary: Internal Medicine

## 2018-09-17 ENCOUNTER — Ambulatory Visit
Admit: 2018-09-17 | Discharge: 2018-09-17 | Payer: PRIVATE HEALTH INSURANCE | Attending: Cardiovascular Disease | Primary: Internal Medicine

## 2018-09-17 DIAGNOSIS — Z8673 Personal history of transient ischemic attack (TIA), and cerebral infarction without residual deficits: Secondary | ICD-10-CM

## 2018-09-17 NOTE — Progress Notes (Signed)
TELEHEALTH EVALUATION -- Audio (During COVID-19 public health emergency)         Cardiology Consultation   Reason for Consultation: CVA  Chief Complaint:   Chief Complaint   Patient presents with   ??? Follow-Up from Hospital     Acute CVA/ no chest pain, SOB, edema, palpitations, headaches, or fatigue   ??? Dizziness     occasionally in morning, when he is waking up and getting out of bed         Subjective:   History of Present Illness:  Peter Rhodes is a 38 y.o. male who presents with the above complaint. He was admitted to [facility] 07/29/18 after sudden onset of slurred speech.      Today, he reports feeling well overall. He denies any residual symptoms from his previous CVA. He has followed up with neurology and has been referred to hematology for further testing as well. He denies any symptoms of chest pain, pressure, tightness, shortness of breath at rest, DOE, nausea, vomiting, near syncope, syncope, heart racing, palpitations, dizziness, lightheaded, PND, orthopnea, wheezing, diaphoresis, BLE edema, bilateral lower extremities pain, cramping or fatigue.     Prior cardiac testing:    Echo 07/31/18  Normal left ventricle size, wall thickness, and low normal systolic function  with an estimated ejection fraction of 50-55%. No regional wall motion  abnormalities are seen. Normal diastolic function.  The right ventricle is normal in size and function.  No significant valve abnormalities noted.  A bubble study was performed and fails to show evidence of shunting with and  without provocative maneuvers.    TEE 08/01/18  Overall left ventricular systolic function appears normal, EF 50%  There is no evidence of mass or thrombus in the left atrium or appendage.  No evidence of patent foramen ovale.     Event monitor 08/03/18-09/08/18  No arrhythmias noted.  Frequent PACs       Past Medical History:   has no past medical history on  file.    Surgical History:   has no past surgical history on file.     Social History:   reports that he has been smoking. He has been smoking about 0.25 packs per day. He has never used smokeless tobacco. He reports current alcohol use. He reports that he does not use drugs.     Family History:  family history is not on file.    Home Medications:  Were reviewed and are listed in nursing record and/or below  Prior to Admission medications    Medication Sig Start Date End Date Taking? Authorizing Provider   aspirin 81 MG EC tablet Take 1 tablet by mouth daily 08/01/18  Yes Evghenii Bacanurschi, MD   atorvastatin (LIPITOR) 80 MG tablet Take 1 tablet by mouth nightly 08/01/18  Yes Evghenii Bacanurschi, MD   clopidogrel (PLAVIX) 75 MG tablet Take 1 tablet by mouth daily 08/01/18  Yes Evghenii Bacanurschi, MD   vitamin C (ASCORBIC ACID) 500 MG tablet Take 500 mg by mouth daily    Historical Provider, MD          Allergies:  Patient has no known allergies.     Review of Systems:   ?? Constitutional: no unanticipated weight loss. There's been no change in energy level, sleep pattern, or activity level. No fevers, chills.   ?? Eyes: No visual changes or diplopia. No scleral icterus.  ?? ENT: No Headaches, hearing loss or vertigo. No mouth sores or sore throat.  ??  Cardiovascular: as reviewed in HPI  ?? Respiratory: No cough or wheezing, no sputum production. No hemoptysis.    ?? Gastrointestinal: No abdominal pain, appetite loss, blood in stools. No change in bowel or bladder habits.  ?? Genitourinary: No dysuria, trouble voiding, or hematuria.  ?? Musculoskeletal:  No gait disturbance, no joint complaints.  ?? Integumentary: No rash or pruritis.  ?? Neurological: No headache, diplopia, change in muscle strength, numbness or tingling.   ?? Psychiatric: No anxiety or depression.  ?? Endocrine: No temperature intolerance. No excessive thirst, fluid intake, or urination. No tremor.  ?? Hematologic/Lymphatic: No abnormal bruising or bleeding,  blood clots or swollen lymph nodes.  ?? Allergic/Immunologic: No nasal congestion or hives.    Objective:   PHYSICAL EXAM:    Vitals:    09/17/18 1011   Pulse: 81    Weight: 183 lb (83 kg)(patient weighed himself 09/17/2018)       General Appearance:  Alert, cooperative, no distress, appears stated age.   Head:  Normocephalic, without obvious abnormality, atraumatic.   Eyes:  Pupils equal and round. No scleral icterus.   Mouth: Moist mucosa, no pharyngeal erythema.   Nose: Nares normal. No drainage or sinus tenderness.   Neck: Supple, symmetrical, trachea midline. No adenopathy. No tenderness/mass/nodules. No carotid bruit or elevated JVD.   Lungs:   Clear to auscultation bilaterally, respirations unlabored. No wheeze, rales, or rhonchi.   Chest Wall:  No tenderness or deformity.   Heart:  Regular rate. S1/S2 normal. No murmur, rub, or gallop.   Abdomen:   Soft, non-tender, bowel sounds active.   Musculoskeletal: No muscle wasting or digital clubbing.   Extremities: Extremities normal, atraumatic. No cyanosis or edema.   Pulses: 2+ radial and carotid pulses, symmetric.   Skin: No rashes or lesions.   Pysch: Normal mood and affect. Alert and oriented x 4.   Neurologic: Normal gross motor and sensory exam.       Labs     CBC:   Lab Results   Component Value Date    WBC 7.4 07/30/2018    RBC 4.89 07/30/2018    HGB 14.7 07/30/2018    HCT 43.0 07/30/2018    MCV 87.8 07/30/2018    RDW 13.1 07/30/2018    PLT 334 07/30/2018     CMP:  Lab Results   Component Value Date    NA 139 07/29/2018    K 4.3 07/29/2018    CL 101 07/29/2018    CO2 27 07/29/2018    BUN 22 07/29/2018    CREATININE 0.8 07/29/2018    GFRAA >60 07/29/2018    AGRATIO 1.8 07/29/2018    LABGLOM >60 07/29/2018    GLUCOSE 96 07/29/2018    PROT 6.7 07/29/2018    CALCIUM 9.5 07/29/2018    BILITOT 0.5 07/29/2018    ALKPHOS 54 07/29/2018    AST 20 07/29/2018    ALT 22 07/29/2018     PT/INR:  No results found for: PTINR  HgBA1c:  Lab Results   Component Value Date     LABA1C 5.1 07/30/2018     Lab Results   Component Value Date    TROPONINI <0.01 07/29/2018       CURRENT Medications:  Current Outpatient Medications on File Prior to Visit   Medication Sig Dispense Refill   ??? aspirin 81 MG EC tablet Take 1 tablet by mouth daily 30 tablet 3   ??? atorvastatin (LIPITOR) 80 MG tablet Take 1 tablet by mouth nightly  30 tablet 3   ??? clopidogrel (PLAVIX) 75 MG tablet Take 1 tablet by mouth daily 30 tablet 0   ??? vitamin C (ASCORBIC ACID) 500 MG tablet Take 500 mg by mouth daily       No current facility-administered medications on file prior to visit.        Assessment and Plan   1) CVA   Will be seen by hematology regarding duration of Plavix   Repeat fasting lipid profile prior to next office visit     2) Tobacco abuse   Encouraged continued smoking cessation     Follow up in 6 months     Peter Rhodes is a 38 y.o. male evaluated via telephone on 09/17/2018.      Consent:  He and/or health care decision maker is aware that that he may receive a bill for this telephone service, depending on his insurance coverage, and has provided verbal consent to proceed: Yes      I affirm this is a Patient Initiated Episode with an Established Patient who has not had a related appointment within my department in the past 7 days or scheduled within the next 24 hours.    Total Time: minutes: 21-30 minutes    Note: not billable if this call serves to triage the patient into an appointment for the relevant concern      Vinie Sill         Thank you for allowing [place] to participate in the care of Peter Rhodes.      This note was scribed in the presence of Caprice Beaver, MD by [employer], RN.      Caprice Beaver, MD [facility]  General and Interventional Cardiology   [facility]   (C): [phone]  [place]): [phone]

## 2018-09-27 NOTE — Telephone Encounter (Signed)
Spoke with [name] regarding stroke cause. Discussed no known cardiac cause at this time and to follow up with hematology. Also stated they would have to follow up with PCP in terms of antibody testing if felt necessary to see if he did have and recover from COVID.     She voiced understanding to all.

## 2018-09-27 NOTE — Telephone Encounter (Signed)
Ileene Hutchinson' wife (okay per HIPAA), called in wanting to speak to Dr. Lynne Logan about his medications and the stroke potentially relating to COVID19.    You can reach [place] at [phone].

## 2018-09-27 NOTE — Telephone Encounter (Signed)
Called/spoke with [name] concerning her husband Peter Rhodes.   [name] stated patient is ok with his medications. But he is concerned that COVID-19 was the cause of his stroke. He was in [place] around the time the COVID began. They were wondering if they could test the blood they collected from him and see if he has the virus. And also if it was the cause of his stroke.   Please advise.

## 2019-03-20 ENCOUNTER — Encounter: Attending: Cardiovascular Disease | Primary: Internal Medicine

## 2019-04-09 ENCOUNTER — Ambulatory Visit
Admit: 2019-04-09 | Discharge: 2019-04-09 | Payer: PRIVATE HEALTH INSURANCE | Attending: Cardiovascular Disease | Primary: Internal Medicine

## 2019-04-09 DIAGNOSIS — E785 Hyperlipidemia, unspecified: Secondary | ICD-10-CM

## 2019-04-09 NOTE — Patient Instructions (Signed)
Patient Education        Learning About High Cholesterol  What is high cholesterol?     High cholesterol means that you have too much cholesterol in your blood. Cholesterol is a type of fat. It's needed for many body functions, such as making new cells. Cholesterol is made by your body. It also comes from food you eat.  Having high cholesterol can lead to the buildup of plaque in artery walls. This can increase your risk of heart disease and stroke.  When your doctor talks about high cholesterol levels, he or she is talking about your total cholesterol and LDL cholesterol (the "bad" cholesterol) levels. Your doctor may also speak about HDL (the "good" cholesterol) levels. High HDL is linked with a lower risk for heart disease, heart attack, and stroke.  Your cholesterol levels help your doctor find out your risk for having a heart attack or stroke.  How can you prevent high cholesterol?  A heart-healthy lifestyle can help you prevent high cholesterol. This lifestyle helps lower your risk for a heart attack and stroke.  ?? Eat heart-healthy foods.  ? Eat fruits, vegetables, whole grains (like oatmeal), dried beans and peas, nuts and seeds, soy products (like tofu), and fat-free or low-fat dairy products.  ? Replace butter, margarine, and hydrogenated or partially hydrogenated oils with olive and canola oils. (Canola oil margarine without trans fat is fine.)  ? Replace red meat with fish, poultry, and soy protein (like tofu).  ? Limit processed and packaged foods like chips, crackers, and cookies.  ?? Be active. Exercise can improve your cholesterol level. Get at least 30 minutes of exercise on most days of the week. Walking is a good choice. You also may want to do other activities, such as running, swimming, cycling, or playing tennis or team sports.  ?? Stay at a healthy weight. Lose weight if you need to.  ?? Don't smoke. If you need help quitting, talk to your doctor about stop-smoking programs and medicines. These  can increase your chances of quitting for good.  How is high cholesterol treated?  The goal of treatment is to reduce your chances of having a heart attack or stroke. The goal is not to lower your cholesterol numbers only.  ?? You may make lifestyle changes, such as eating healthy foods, not smoking, losing weight, and being more active.  ?? You may have to take medicine.  Follow-up care is a key part of your treatment and safety. Be sure to make and go to all appointments, and call your doctor if you are having problems. It's also a good idea to know your test results and keep a list of the medicines you take.  Where can you learn more?  Go to [email or web address] and sign in to your MyChart account. Enter Q621 in the Search Health Information box to learn more about "Learning About High Cholesterol."     If you do not have an account, please click on the "Sign Up Now" link.  Current as of: May 21, 2018??????????????????????????????Content Version: 12.6  ?? 2006-2020 [employer].   Care instructions adapted under license by [facility]. If you have questions about a medical condition or this instruction, always ask your healthcare professional. [employer], Inc[employer] disclaims any warranty or liability for your use of this information.

## 2019-04-09 NOTE — Progress Notes (Signed)
Cardiology Consultation   Chief Complaint:   Chief Complaint   Patient presents with   ??? 6 Month Follow-Up     No CC         Subjective:   History of Present Illness:  Peter Rhodes is a 38 y.o. male who presents with the above complaint. He was [facility] to [facility] 07/[facility]/18 after sudden onset of slurred speech Associated with dysarthria. No h/o similar complaints. No alleviating or exacerbating factors. No significant PMHx. Cardiology was consulted for TEE. Last visit was virtual. Patient denied any residuals from CVA. Following with neurology and hematology. Today patient returns in follow up. Says he feels fine. Reports compliance with medical therapy. Works from home. Quit smoking after stroke. Patient denies any chest pain, pressure, tightness, nausea, vomiting, diaphoresis, SOB/DOE, palpitations, heart racing, dizziness/lightheadedness, orthopnea, PND, LE edema or syncope.      Prior cardiac testing:    EKG: none today     Echo 07/31/18  Normal left ventricle size, wall thickness, and low normal systolic function  with an estimated ejection fraction of 50-55%. No regional wall motion  abnormalities are seen. Normal diastolic function.  The right ventricle is normal in size and function.  No significant valve abnormalities noted.  A bubble study was performed and fails to show evidence of shunting with and  without provocative maneuvers.  ??  TEE 08/01/18  Overall left ventricular systolic function appears normal, EF 50%  There is no evidence of mass or thrombus in the left atrium or appendage.  No evidence of patent foramen ovale.  ??  Event monitor 08/03/18-09/08/18  No arrhythmias noted.  Frequent PACs       Past Medical History:   has no past medical history on file.    Surgical History:   has no past surgical history on file.     Social History:   reports that he has quit smoking. He smoked  0.25 packs per day. He has never used smokeless tobacco. He reports current alcohol use. He reports that he does not use drugs.     Family History:  family history includes Heart Surgery in his father.    Home Medications:  Were reviewed and are listed in nursing record and/or below  Prior to Admission medications    Medication Sig Start Date End Date Taking? Authorizing Provider   aspirin 81 MG EC tablet Take 1 tablet by mouth daily 08/01/18  Yes Evghenii Bacanurschi, MD   atorvastatin (LIPITOR) 80 MG tablet Take 1 tablet by mouth nightly 08/01/18  Yes Evghenii Bacanurschi, MD   clopidogrel (PLAVIX) 75 MG tablet Take 1 tablet by mouth daily 08/01/18  Yes Bernette MayersEvghenii Bacanurschi, MD          Allergies:  Patient has no known allergies.     Review of Systems:   ?? Constitutional: no unanticipated weight loss. There's been no change in energy level, sleep pattern, or activity level. No fevers, chills.   ?? Eyes: No visual changes or diplopia. No scleral icterus.  ?? ENT: No Headaches, hearing loss or vertigo. No mouth sores or sore throat.  ?? Cardiovascular: as reviewed in HPI  ?? Respiratory: No cough or wheezing, no sputum production. No hemoptysis.    ?? Gastrointestinal: No abdominal pain, appetite loss, blood in stools. No change in bowel or bladder habits.  ?? Genitourinary: No dysuria, trouble voiding, or hematuria.  ?? Musculoskeletal:  No gait disturbance, no joint complaints.  ?? Integumentary: No rash or pruritis.  ??  Neurological: No headache, diplopia, change in muscle strength, numbness or tingling.   ?? Psychiatric: No anxiety or depression.  ?? Endocrine: No temperature intolerance. No excessive thirst, fluid intake, or urination. No tremor.  ?? Hematologic/Lymphatic: No abnormal bruising or bleeding, blood clots or swollen lymph nodes.  ?? Allergic/Immunologic: No nasal congestion or hives.    Objective:   PHYSICAL EXAM:    Vitals:    04/09/19 0818   BP: 100/64   Pulse: 73   Temp: 97.5 ??F (36.4 ??C)   SpO2: 99%    Weight:  194 lb 3.2 oz (88.1 kg)       General Appearance:  Alert, cooperative, no distress, appears stated age.   Head:  Normocephalic, without obvious abnormality, atraumatic.   Eyes:  Pupils equal and round. No scleral icterus.   Mouth: Moist mucosa, no pharyngeal erythema.   Nose: Nares normal. No drainage or sinus tenderness.   Neck: Supple, symmetrical, trachea midline. No adenopathy. No tenderness/mass/nodules. No carotid bruit or elevated JVD.   Lungs:   Clear to auscultation bilaterally, respirations unlabored. No wheeze, rales, or rhonchi.   Chest Wall:  No tenderness or deformity.   Heart:  Regular rate. S1/S2 normal. No murmur, rub, or gallop.   Abdomen:   Soft, non-tender, bowel sounds active.   Musculoskeletal: No muscle wasting or digital clubbing.   Extremities: Extremities normal, atraumatic. No cyanosis or edema.   Pulses: 2+ radial and carotid pulses, symmetric.   Skin: No rashes or lesions.   Pysch: Normal mood and affect. Alert and oriented x 4.   Neurologic: Normal gross motor and sensory exam.       Labs     CBC:   Lab Results   Component Value Date    WBC 7.4 07/30/2018    RBC 4.89 07/30/2018    HGB 14.7 07/30/2018    HCT 43.0 07/30/2018    MCV 87.8 07/30/2018    RDW 13.1 07/30/2018    PLT 334 07/30/2018     CMP:  Lab Results   Component Value Date    NA 139 07/29/2018    K 4.3 07/29/2018    CL 101 07/29/2018    CO2 27 07/29/2018    BUN 22 07/29/2018    CREATININE 0.8 07/29/2018    GFRAA >60 07/29/2018    AGRATIO 1.8 07/29/2018    LABGLOM >60 07/29/2018    GLUCOSE 96 07/29/2018    PROT 6.7 07/29/2018    CALCIUM 9.5 07/29/2018    BILITOT 0.5 07/29/2018    ALKPHOS 54 07/29/2018    AST 20 07/29/2018    ALT 22 07/29/2018     PT/INR:  No results found for: PTINR  HgBA1c:  Lab Results   Component Value Date/Time    LABA1C 5.1 07/30/2018 07:02 AM     Troponin:   Lab Results   Component Value Date/Time    TROPONINI <0.01 07/29/2018 10:29 AM         CURRENT Medications:  Current Outpatient Medications on File  Prior to Visit   Medication Sig Dispense Refill   ??? aspirin 81 MG EC tablet Take 1 tablet by mouth daily 30 tablet 3   ??? atorvastatin (LIPITOR) 80 MG tablet Take 1 tablet by mouth nightly 30 tablet 3   ??? clopidogrel (PLAVIX) 75 MG tablet Take 1 tablet by mouth daily 30 tablet 0     No current facility-administered medications on file prior to visit.        Assessment and  Plan   1) CVA   Recommend discontinuation of Plavix in 1 year.   Asked him to discuss with Neurology regarding the duration of Plavix   Reviewed lipid panel 01/24/19; WNL. Repeat fasting lipid panel in 6-8 weeks   Patient reduced the dose of Atorvastatin to 40 mg nightly due to muscle aches; now tolerating   Will be on ASA and Statin for life   ??  2) Tobacco abuse   Encouraged continued smoking cessation   ??  Follow up in 1 year     Thank you for allowing [place] to participate in the care of Peter Stanford, MD [facility]  General and Interventional Cardiology   [facility]   (C): [phone]  [place]): [phone]     This note was scribed in the presence of Dr. Caprice Beaver by Brown Human, RN    Physician Attestation:  The scribes documentation has been prepared under my direction and personally reviewed by me in its entirety.     I confirm the note above accurately reflects all work, treatment, procedures, and medical decision making performed by me.    Electronically signed by Vinie Sill, MD on 04/17/2019 at 11:48 AM

## 2019-10-21 ENCOUNTER — Ambulatory Visit: Admit: 2019-10-21 | Discharge: 2019-10-21 | Payer: PRIVATE HEALTH INSURANCE

## 2019-10-21 DIAGNOSIS — D229 Melanocytic nevi, unspecified: Secondary | ICD-10-CM

## 2019-10-21 NOTE — Unmapped (Signed)
Subjective   HPI:   Patient ID: Todd Barnett is a 39 y.o. male.    Chief Complaint:  Chief Complaint   Patient presents with   ??? Skin Lesion     Pt. is here for a full body skin check. No personal hx of CA.      New patient   1. Here for skin check Lifelong hx of moles on body, no new, changing, growing leisons      ADDITIONAL HISTORY:   I have reviewed past medical and surgical histories, medications, allergies, social and family histories as documented in the patient's electronic medical record.   Relevant additional history: None           ROS:   Review of Systems  Gen: in usual state of good health      Objective:   Physical Exam  Derm Physical Exam:  Areas examined: psych, scalp, face, eyes, mouth, neck, chest, back, abdomen, rue, lue, rle, lle, digits    Pathology:   Gen: well-appearing NAD  Skin exam:  1.  Trunk and extremities with multiple brown macules and few stuck on papules         Assessment/Plan:   1. Multiple nevi and few SK - no concerning lesions evident today  Ed re: sunprecautions (wear hat and long sleeves, avoid 10-2, spf 30 daily to face)      rtc prn

## 2019-12-02 ENCOUNTER — Ambulatory Visit: Admit: 2019-12-02 | Discharge: 2019-12-02 | Payer: PRIVATE HEALTH INSURANCE

## 2019-12-02 DIAGNOSIS — D485 Neoplasm of uncertain behavior of skin: Secondary | ICD-10-CM

## 2019-12-02 NOTE — Unmapped (Signed)
S: here for removal of symptomatic sk on r lower eyelid  O: r lower eyelid with 3mm filiform papule  A: irritated seborrheic keratosis  P: given location and symptoms will shave excise  Shave excision   -Informed consent obtained   -Area marked, cleaned, anesthetized with 1% lidocaine with epinephrine    -Shave performed to mid dermis all visible lesion removed   -Hemostasis obtained with aluminum chloride   -Polysporin ointment and bandage applied   -Specimen(s) sent to [facility]   -Edu: woundcare, bleeding, infection, scar

## 2020-02-25 ENCOUNTER — Ambulatory Visit: Payer: Self-pay

## 2020-04-14 ENCOUNTER — Encounter: Attending: Cardiovascular Disease | Primary: Internal Medicine

## 2020-04-14 NOTE — Progress Notes (Deleted)
Cardiology Consultation   Chief Complaint:   No chief complaint on file.        Subjective:   History of Present Illness:  Peter Rhodes is a 39 y.o. male who presents with the above complaint. He was admitted to [facility] 07/29/18 after sudden onset of slurred speech Associated with dysarthria. No h/o similar complaints. No alleviating or exacerbating factors. No significant PMHx. Cardiology was consulted for TEE. Last visit was virtual. Patient denied any residuals from CVA. Following with neurology and hematology. Today patient returns in follow up. Says he feels fine. Reports compliance with medical therapy. Works from home. Quit smoking after stroke. Patient denies any chest pain, pressure, tightness, nausea, vomiting, diaphoresis, SOB/DOE, palpitations, heart racing, dizziness/lightheadedness, orthopnea, PND, LE edema or syncope.      Prior cardiac testing:    EKG: none today     Echo 07/31/18  Normal left ventricle size, wall thickness, and low normal systolic function  with an estimated ejection fraction of 50-55%. No regional wall motion  abnormalities are seen. Normal diastolic function.  The right ventricle is normal in size and function.  No significant valve abnormalities noted.  A bubble study was performed and fails to show evidence of shunting with and  without provocative maneuvers.  ??  TEE 08/01/18  Overall left ventricular systolic function appears normal, EF 50%  There is no evidence of mass or thrombus in the left atrium or appendage.  No evidence of patent foramen ovale.  ??  Event monitor 08/03/18-09/08/18  No arrhythmias noted.  Frequent PACs       Past Medical History:   has no past medical history on file.    Surgical History:   has no past surgical history on file.     Social History:   reports that he has quit smoking. He smoked 0.25 packs per day. He has never used  smokeless tobacco. He reports current alcohol use. He reports that he does not use drugs.     Family History:  family history includes Heart Surgery in his father.    Home Medications:  Were reviewed and are listed in nursing record and/or below  Prior to Admission medications    Medication Sig Start Date End Date Taking? Authorizing Provider   aspirin 81 MG EC tablet Take 1 tablet by mouth daily 08/01/18   Bernette Mayers, MD   atorvastatin (LIPITOR) 80 MG tablet Take 1 tablet by mouth nightly 08/01/18   Bernette Mayers, MD   clopidogrel (PLAVIX) 75 MG tablet Take 1 tablet by mouth daily 08/01/18   Bernette Mayers, MD          Allergies:  Patient has no known allergies.     Review of Systems:   ?? Constitutional: no unanticipated weight loss. There's been no change in energy level, sleep pattern, or activity level. No fevers, chills.   ?? Eyes: No visual changes or diplopia. No scleral icterus.  ?? ENT: No Headaches, hearing loss or vertigo. No mouth sores or sore throat.  ?? Cardiovascular: as reviewed in HPI  ?? Respiratory: No cough or wheezing, no sputum production. No hemoptysis.    ?? Gastrointestinal: No abdominal pain, appetite loss, blood in stools. No change in bowel or bladder habits.  ?? Genitourinary: No dysuria, trouble voiding, or hematuria.  ?? Musculoskeletal:  No gait disturbance, no joint complaints.  ?? Integumentary: No rash or pruritis.  ?? Neurological: No headache, diplopia, change in muscle strength, numbness or tingling.   ??  Psychiatric: No anxiety or depression.  ?? Endocrine: No temperature intolerance. No excessive thirst, fluid intake, or urination. No tremor.  ?? Hematologic/Lymphatic: No abnormal bruising or bleeding, blood clots or swollen lymph nodes.  ?? Allergic/Immunologic: No nasal congestion or hives.    Objective:   PHYSICAL EXAM:    There were no vitals filed for this visit.         General Appearance:  Alert, cooperative, no distress, appears stated age.   Head:   Normocephalic, without obvious abnormality, atraumatic.   Eyes:  Pupils equal and round. No scleral icterus.   Mouth: Moist mucosa, no pharyngeal erythema.   Nose: Nares normal. No drainage or sinus tenderness.   Neck: Supple, symmetrical, trachea midline. No adenopathy. No tenderness/mass/nodules. No carotid bruit or elevated JVD.   Lungs:   Clear to auscultation bilaterally, respirations unlabored. No wheeze, rales, or rhonchi.   Chest Wall:  No tenderness or deformity.   Heart:  Regular rate. S1/S2 normal. No murmur, rub, or gallop.   Abdomen:   Soft, non-tender, bowel sounds active.   Musculoskeletal: No muscle wasting or digital clubbing.   Extremities: Extremities normal, atraumatic. No cyanosis or edema.   Pulses: 2+ radial and carotid pulses, symmetric.   Skin: No rashes or lesions.   Pysch: Normal mood and affect. Alert and oriented x 4.   Neurologic: Normal gross motor and sensory exam.       Labs     CBC:   Lab Results   Component Value Date    WBC 7.4 07/30/2018    RBC 4.89 07/30/2018    HGB 14.7 07/30/2018    HCT 43.0 07/30/2018    MCV 87.8 07/30/2018    RDW 13.1 07/30/2018    PLT 334 07/30/2018     CMP:  Lab Results   Component Value Date    NA 139 07/29/2018    K 4.3 07/29/2018    CL 101 07/29/2018    CO2 27 07/29/2018    BUN 22 07/29/2018    CREATININE 0.8 07/29/2018    GFRAA >60 07/29/2018    AGRATIO 1.8 07/29/2018    LABGLOM >60 07/29/2018    GLUCOSE 96 07/29/2018    PROT 6.7 07/29/2018    CALCIUM 9.5 07/29/2018    BILITOT 0.5 07/29/2018    ALKPHOS 54 07/29/2018    AST 20 07/29/2018    ALT 22 07/29/2018     PT/INR:  No results found for: PTINR  HgBA1c:  Lab Results   Component Value Date/Time    LABA1C 5.1 07/30/2018 07:02 AM     Troponin:   Lab Results   Component Value Date/Time    TROPONINI <0.01 07/29/2018 10:29 AM         CURRENT Medications:  Current Outpatient Medications on File Prior to Visit   Medication Sig Dispense Refill   ??? aspirin 81 MG EC tablet Take 1 tablet by mouth daily 30  tablet 3   ??? atorvastatin (LIPITOR) 80 MG tablet Take 1 tablet by mouth nightly 30 tablet 3   ??? clopidogrel (PLAVIX) 75 MG tablet Take 1 tablet by mouth daily 30 tablet 0     No current facility-administered medications on file prior to visit.       Assessment and Plan   1) CVA   Remains on ASA, Plavix and Statin  Reviewed lipid panel 01/2019  Speak with Neurology about stopping Plavix     2) Tobacco abuse   Encouraged continued smoking cessation   ??  Follow up in 1 year     Thank you for allowing [place] to participate in the care of Lavena Stanford, MD [facility]  General and Interventional Cardiology   [facility]   (C): [phone]  [place]): [phone]     This note was scribed in the presence of Dr. Caprice Beaver by Brown Human, RN

## 2020-04-16 ENCOUNTER — Encounter: Attending: Cardiovascular Disease | Primary: Internal Medicine

## 2020-04-17 ENCOUNTER — Encounter: Attending: Cardiovascular Disease | Primary: Internal Medicine
